# Patient Record
Sex: Female | Born: 1995 | Race: White | Hispanic: No | Marital: Single | Smoking: Never smoker
Health system: Southern US, Community
[De-identification: ages and names within clinical notes are randomized; demographics above are authoritative.]

## PROBLEM LIST (undated history)

## (undated) DIAGNOSIS — B009 Herpesviral infection, unspecified: Secondary | ICD-10-CM

## (undated) DIAGNOSIS — E119 Type 2 diabetes mellitus without complications: Secondary | ICD-10-CM

## (undated) DIAGNOSIS — I10 Essential (primary) hypertension: Secondary | ICD-10-CM

## (undated) DIAGNOSIS — E282 Polycystic ovarian syndrome: Secondary | ICD-10-CM

## (undated) DIAGNOSIS — F419 Anxiety disorder, unspecified: Secondary | ICD-10-CM

## (undated) HISTORY — DX: Essential (primary) hypertension: I10

## (undated) HISTORY — DX: Herpesviral infection, unspecified: B00.9

## (undated) HISTORY — PX: WISDOM TOOTH EXTRACTION: SHX21

## (undated) HISTORY — DX: Anxiety disorder, unspecified: F41.9

## (undated) HISTORY — DX: Polycystic ovarian syndrome: E28.2

---
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
## 2014-01-03 DIAGNOSIS — E118 Type 2 diabetes mellitus with unspecified complications: Secondary | ICD-10-CM

## 2014-01-03 DIAGNOSIS — L293 Anogenital pruritus, unspecified: Secondary | ICD-10-CM | POA: Insufficient documentation

## 2014-01-03 DIAGNOSIS — A6000 Herpesviral infection of urogenital system, unspecified: Secondary | ICD-10-CM | POA: Insufficient documentation

## 2014-01-03 DIAGNOSIS — E1165 Type 2 diabetes mellitus with hyperglycemia: Secondary | ICD-10-CM | POA: Insufficient documentation

## 2014-01-03 DIAGNOSIS — B373 Candidiasis of vulva and vagina: Secondary | ICD-10-CM | POA: Insufficient documentation

## 2014-01-03 DIAGNOSIS — N760 Acute vaginitis: Secondary | ICD-10-CM | POA: Insufficient documentation

## 2014-01-03 DIAGNOSIS — IMO0002 Reserved for concepts with insufficient information to code with codable children: Secondary | ICD-10-CM | POA: Insufficient documentation

## 2014-01-03 DIAGNOSIS — B3731 Acute candidiasis of vulva and vagina: Secondary | ICD-10-CM | POA: Insufficient documentation

## 2014-01-03 DIAGNOSIS — D72829 Elevated white blood cell count, unspecified: Secondary | ICD-10-CM | POA: Insufficient documentation

## 2014-01-04 ENCOUNTER — Telehealth: Payer: Self-pay | Admitting: *Deleted

## 2014-01-04 ENCOUNTER — Encounter (HOSPITAL_COMMUNITY): Payer: Self-pay | Admitting: Emergency Medicine

## 2014-01-04 ENCOUNTER — Inpatient Hospital Stay (HOSPITAL_COMMUNITY)
Admission: EM | Admit: 2014-01-04 | Discharge: 2014-01-04 | Disposition: A | Discharge status: Home / Self Care | Payer: Medicaid Other | Attending: Family Medicine | Admitting: Family Medicine

## 2014-01-04 DIAGNOSIS — N762 Acute vulvitis: Secondary | ICD-10-CM

## 2014-01-04 DIAGNOSIS — E119 Type 2 diabetes mellitus without complications: Secondary | ICD-10-CM

## 2014-01-04 DIAGNOSIS — B373 Candidiasis of vulva and vagina: Secondary | ICD-10-CM

## 2014-01-04 DIAGNOSIS — B3731 Acute candidiasis of vulva and vagina: Secondary | ICD-10-CM

## 2014-01-04 LAB — URINALYSIS, ROUTINE W REFLEX MICROSCOPIC
Bilirubin Urine: NEGATIVE
Glucose, UA: >1000 mg/dL — AB
Ketones, ur: NEGATIVE mg/dL
Nitrite: NEGATIVE
PROTEIN: 100 mg/dL — AB
Specific Gravity, Urine: 1.04 — ABNORMAL HIGH (ref 1.005–1.030)
Urobilinogen, UA: 0.2 mg/dL (ref 0.0–1.0)
pH: 5 (ref 5.0–8.0)

## 2014-01-04 LAB — WET PREP, GENITAL
Clue Cells Wet Prep HPF POC: NONE SEEN
Trich, Wet Prep: NONE SEEN
Yeast Wet Prep HPF POC: NONE SEEN

## 2014-01-04 LAB — BASIC METABOLIC PANEL
BUN: 13 mg/dL (ref 6–23)
CALCIUM: 9.5 mg/dL (ref 8.4–10.5)
CO2: 25 meq/L (ref 19–32)
Chloride: 94 mEq/L — ABNORMAL LOW (ref 96–112)
Creatinine, Ser: 0.54 mg/dL (ref 0.50–1.10)
Glucose, Bld: 264 mg/dL — ABNORMAL HIGH (ref 70–99)
Potassium: 3.7 mEq/L (ref 3.7–5.3)
SODIUM: 136 meq/L — AB (ref 137–147)

## 2014-01-04 LAB — CBC WITH DIFFERENTIAL/PLATELET
BASOS ABS: 0 10*3/uL (ref 0.0–0.1)
BASOS PCT: 0 % (ref 0–1)
EOS ABS: 0.2 10*3/uL (ref 0.0–0.7)
Eosinophils Relative: 1 % (ref 0–5)
HCT: 43.3 % (ref 36.0–46.0)
Hemoglobin: 15.7 g/dL — ABNORMAL HIGH (ref 12.0–15.0)
Lymphocytes Relative: 24 % (ref 12–46)
Lymphs Abs: 3.7 10*3/uL (ref 0.7–4.0)
MCH: 30 pg (ref 26.0–34.0)
MCHC: 36.3 g/dL — ABNORMAL HIGH (ref 30.0–36.0)
MCV: 82.8 fL (ref 78.0–100.0)
Monocytes Absolute: 1 10*3/uL (ref 0.1–1.0)
Monocytes Relative: 6 % (ref 3–12)
NEUTROS PCT: 69 % (ref 43–77)
Neutro Abs: 10.3 10*3/uL — ABNORMAL HIGH (ref 1.7–7.7)
PLATELETS: 421 10*3/uL — AB (ref 150–400)
RBC: 5.23 MIL/uL — AB (ref 3.87–5.11)
RDW: 12.5 % (ref 11.5–15.5)
WBC: 15.2 10*3/uL — ABNORMAL HIGH (ref 4.0–10.5)

## 2014-01-04 LAB — URINE MICROSCOPIC-ADD ON

## 2014-01-04 LAB — POC URINE PREG, ED: Preg Test, Ur: NEGATIVE

## 2014-01-04 LAB — CBG MONITORING, ED: GLUCOSE-CAPILLARY: 259 mg/dL — AB (ref 70–99)

## 2014-01-04 MED ORDER — VALACYCLOVIR HCL 1 G PO TABS: 1000.0000 mg | ORAL_TABLET | Freq: Two times a day (BID) | ORAL | Status: AC

## 2014-01-04 MED ORDER — FLUCONAZOLE 150 MG PO TABS: 150.0000 mg | ORAL_TABLET | Freq: Every day | ORAL | Status: DC

## 2014-01-04 MED ORDER — SODIUM CHLORIDE 0.9 % IV BOLUS (SEPSIS)
1000.0000 mL | Freq: Once | INTRAVENOUS | Status: AC
  Administered 2014-01-04: 1000 mL via INTRAVENOUS

## 2014-01-04 MED ORDER — PIPERACILLIN-TAZOBACTAM 3.375 G IVPB 30 MIN
3.3750 g | Freq: Once | INTRAVENOUS | Status: AC
  Administered 2014-01-04: 3.375 g via INTRAVENOUS
  Filled 2014-01-04: qty 50

## 2014-01-04 MED ORDER — CEPHALEXIN 500 MG PO CAPS: 500.0000 mg | ORAL_CAPSULE | Freq: Four times a day (QID) | ORAL | Status: DC

## 2014-01-04 MED ORDER — FLUCONAZOLE 100 MG PO TABS
150.0000 mg | ORAL_TABLET | Freq: Once | ORAL | Status: AC
  Administered 2014-01-04: 150 mg via ORAL
  Filled 2014-01-04: qty 2

## 2014-01-04 MED ORDER — KETOROLAC TROMETHAMINE 30 MG/ML IJ SOLN
30.0000 mg | Freq: Once | INTRAMUSCULAR | Status: AC
  Administered 2014-01-04: 30 mg via INTRAVENOUS
  Filled 2014-01-04: qty 1

## 2014-01-04 MED ORDER — METFORMIN HCL 1000 MG PO TABS: 1000.0000 mg | ORAL_TABLET | Freq: Two times a day (BID) | ORAL | Status: DC

## 2014-01-04 NOTE — MAU Note (Signed)
C/o vaginal burning and vaginal pain since yesterday; pt thought at first that she had a yeast infection;

## 2014-01-04 NOTE — ED Notes (Signed)
[facility] arrived  

## 2014-01-04 NOTE — ED Notes (Signed)
Unable to give urine specimen at triage.  

## 2014-01-04 NOTE — ED Notes (Signed)
Notified [facility] of transportation to [facility]

## 2014-01-04 NOTE — ED Notes (Signed)
Pt. reports vaginal itching with swelling and pain onset today / dysuria .

## 2014-01-04 NOTE — ED Notes (Signed)
Dr. Gwenlyn FudgeGoldstein performing reassessment

## 2014-01-04 NOTE — Telephone Encounter (Signed)
Message copied by Gerome ApleyZEYFANG, Nyssa Sayegh L on Tue Jan 04, 2014 12:24 PM ------      Message from: [facility] LPRASCH, JENNIFER I      Created: Tue Jan 04, 2014 12:13 PM       GYN clinic            Seen in MAU on 4/7 diagnosed with labial cellulitis, yeast, ? HSV.            Pt is a untreated type 2 diabetic with poor compliance. Can we see her in 2 weeks in the clinic?            [facility] Thank you.  ------

## 2014-01-04 NOTE — MAU Provider Note (Signed)
History     CSN: [id number]  Arrival date and time: 01/03/14 2358   First Provider Initiated Contact with Patient 01/04/14 1117      Chief Complaint  Patient presents with  . Vaginal Itching   HPI  [name] is a 18 y.o. female G0P0 transferred from [place] ED for labial celulitus vs. yeast vaginitis vs. sepsis. The patient is an uncontrolled type 2 diabetic; does not currently have PCP, patient does not check her blood sugars at home. Pt has frequent yeast infections that come and typically subside after three days without treatment.  The vaginal itching started 3-4 days ago and has progressively gotten worse. The patient is not currently sexually active; denies penile-vaginal intercourse, however does admit to oral and finger penetration.    OB History   Grav Para Term Preterm Abortions TAB SAB Ect Mult Living   0               Past Medical History  Diagnosis Date  . Diabetes     Past Surgical History  Procedure Laterality Date  . Wisdom tooth extraction      Family History  Problem Relation Age of Onset  . Hyperthyroidism Mother   . Hypertension Father   . Hyperthyroidism Brother   . Diabetes Paternal Aunt   . Cancer Maternal Grandmother   . Diabetes Paternal Grandmother   . Diabetes Paternal Grandfather   . Heart disease Paternal Grandfather   . Kidney disease Paternal Grandfather   . Hypertension Paternal Grandfather     History  Substance Use Topics  . Smoking status: Never Smoker   . Smokeless tobacco: Not on file  . Alcohol Use: No    Allergies: No Known Allergies  Prescriptions prior to admission  Medication Sig Dispense Refill  . norgestimate-ethinyl estradiol (SPRINTEC 28) 0.25-35 MG-MCG tablet Take 1 tablet by mouth daily.        Results for orders placed during the hospital encounter of 01/04/14 (from the past 48 hour(s))  URINALYSIS, ROUTINE W REFLEX MICROSCOPIC     Status: Abnormal   Collection Time    01/04/14  5:19 AM     Result Value Ref Range   Color, Urine YELLOW  YELLOW   APPearance CLOUDY (*) CLEAR   Specific Gravity, Urine 1.040 (*) 1.005 - 1.030   pH 5.0  5.0 - 8.0   Glucose, UA >1000 (*) NEGATIVE mg/dL   Hgb urine dipstick MODERATE (*) NEGATIVE   Bilirubin Urine NEGATIVE  NEGATIVE   Ketones, ur NEGATIVE  NEGATIVE mg/dL   Protein, ur 100 (*) NEGATIVE mg/dL   Urobilinogen, UA 0.2  0.0 - 1.0 mg/dL   Nitrite NEGATIVE  NEGATIVE   Leukocytes, UA SMALL (*) NEGATIVE  URINE MICROSCOPIC-ADD ON     Status: Abnormal   Collection Time    01/04/14  5:19 AM      Result Value Ref Range   Squamous Epithelial / LPF FEW (*) RARE   WBC, UA 0-2  <3 WBC/hpf   RBC / HPF 3-6  <3 RBC/hpf   Bacteria, UA RARE  RARE   Casts GRANULAR CAST (*) NEGATIVE  POC URINE PREG, ED     Status: None   Collection Time    01/04/14  5:29 AM      Result Value Ref Range   Preg Test, Ur NEGATIVE  NEGATIVE   Comment:            THE SENSITIVITY OF THIS  METHODOLOGY IS >24 mIU/mL  CBG MONITORING, ED     Status: Abnormal   Collection Time    01/04/14  7:03 AM      Result Value Ref Range   Glucose-Capillary 259 (*) 70 - 99 mg/dL  CBC WITH DIFFERENTIAL     Status: Abnormal   Collection Time    01/04/14  7:08 AM      Result Value Ref Range   WBC 15.2 (*) 4.0 - 10.5 K/uL   RBC 5.23 (*) 3.87 - 5.11 MIL/uL   Hemoglobin 15.7 (*) 12.0 - 15.0 g/dL   HCT 43.3  36.0 - 46.0 %   MCV 82.8  78.0 - 100.0 fL   MCH 30.0  26.0 - 34.0 pg   MCHC 36.3 (*) 30.0 - 36.0 g/dL   RDW 12.5  11.5 - 15.5 %   Platelets 421 (*) 150 - 400 K/uL   Neutrophils Relative % 69  43 - 77 %   Neutro Abs 10.3 (*) 1.7 - 7.7 K/uL   Lymphocytes Relative 24  12 - 46 %   Lymphs Abs 3.7  0.7 - 4.0 K/uL   Monocytes Relative 6  3 - 12 %   Monocytes Absolute 1.0  0.1 - 1.0 K/uL   Eosinophils Relative 1  0 - 5 %   Eosinophils Absolute 0.2  0.0 - 0.7 K/uL   Basophils Relative 0  0 - 1 %   Basophils Absolute 0.0  0.0 - 0.1 K/uL  BASIC METABOLIC PANEL     Status:  Abnormal   Collection Time    01/04/14  8:51 AM      Result Value Ref Range   Sodium 136 (*) 137 - 147 mEq/L   Potassium 3.7  3.7 - 5.3 mEq/L   Chloride 94 (*) 96 - 112 mEq/L   CO2 25  19 - 32 mEq/L   Glucose, Bld 264 (*) 70 - 99 mg/dL   BUN 13  6 - 23 mg/dL   Creatinine, Ser 0.54  0.50 - 1.10 mg/dL   Calcium 9.5  8.4 - 10.5 mg/dL   GFR calc non Af Amer >90  >90 mL/min   GFR calc Af Amer >90  >90 mL/min   Comment: (NOTE)     The eGFR has been calculated using the CKD EPI equation.     This calculation has not been validated in all clinical situations.     eGFR's persistently <90 mL/min signify possible Chronic Kidney     Disease.  WET PREP, GENITAL     Status: Abnormal   Collection Time    01/04/14 11:30 AM      Result Value Ref Range   Yeast Wet Prep HPF POC NONE SEEN  NONE SEEN   Trich, Wet Prep NONE SEEN  NONE SEEN   Clue Cells Wet Prep HPF POC NONE SEEN  NONE SEEN   WBC, Wet Prep HPF POC FEW (*) NONE SEEN   Comment: MANY BACTERIA SEEN    Review of Systems  Constitutional: Negative for fever and chills.  Gastrointestinal: Negative for nausea, vomiting and abdominal pain.  Genitourinary: Negative for dysuria and urgency.       + severe vaginal itching and white discharge   Musculoskeletal: Negative for back pain.   Physical Exam   Blood pressure 137/83, pulse 95, temperature 98.3 F (36.8 C), temperature source Oral, resp. rate 18, height 5' 5"  (1.651 m), weight 111.585 kg (246 lb), SpO2 98.00%.  Physical Exam  Constitutional: She  is oriented to person, place, and time. She appears well-developed and well-nourished. No distress.  HENT:  Head: Normocephalic.  Eyes: Pupils are equal, round, and reactive to light.  Neck: Neck supple.  Respiratory: Effort normal.  GI: Soft. She exhibits no distension. There is no tenderness. There is no rebound.  Genitourinary:    There is lesion and injury on the right labia. There is lesion and injury on the left labia. Vaginal  discharge found.  Bilateral labial folds with erythema, edema, and multiple non- tender, pinpoint blisters. Thick, white discharge noted on labial folds.   Speculum exam: Vagina - Moderate amount of thick, white discharge, no odor. Erythema noted on vaginal wall  Cervix - No contact bleeding, no active bleeding  Bimanual exam: Cervix closed, no CMT  Uterus non tender, normal size Adnexa non tender, no masses bilaterally GC/Chlam, wet prep done Chaperone present for exam.   Musculoskeletal: Normal range of motion.  Neurological: She is alert and oriented to person, place, and time.  Skin: Skin is warm. She is not diaphoretic.  Psychiatric: Her behavior is normal.    MAU Course  Procedures None  MDM HSV culture sent Wet prep GC/Chlamydia  Discussed findings with Dr. Nehemiah Settle; plan to treat patient outpatient for yeast vaginitis and ? Labial cellulitis ( WBC 15.2)  and have her follow up in the clinic in 1-2 weeks.  I will RX metformin with 1 refill and refer patient to [facility] for management of type 2 DM.   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

## 2014-01-04 NOTE — ED Notes (Signed)
[facility] called. 

## 2014-01-04 NOTE — ED Notes (Signed)
[facility], will be called back

## 2014-01-04 NOTE — ED Notes (Signed)
Pt states she is experiencing reddened and swollen labia x 3 days.  Area noted to have shaving bumps but not painful per pt.  Labia are cherry red and appear swollen as reported.  C/O increased burning to labia with urination.

## 2014-01-04 NOTE — ED Notes (Signed)
Patient leaving via [facility] now

## 2014-01-04 NOTE — ED Provider Notes (Signed)
CSN: [id number]     Arrival date & time 01/03/14  2358 History   First MD Initiated Contact with Patient 01/04/14 [phone]     Chief Complaint  Patient presents with  . Vaginal Itching     (Consider location/radiation/quality/duration/timing/severity/associated sxs/prior Treatment) HPI Patient is an 18 year old girl with poorly controlled type 2 diabetes. She presents with complaints of burning pain over the labia majora bilaterally. She's not sexually active. She denies vaginal discharge. Her symptoms started 5 days ago with persistent itching. Pain developed 3d ago and has progressively worsened. She denies fever. She does not checked her blood glucose levels.  Patient describes her pain as severe and nonradiating. It is worse with certain movements.  Past Medical History  Diagnosis Date  . Diabetes    History reviewed. No pertinent past surgical history. No family history on file. History  Substance Use Topics  . Smoking status: Never Smoker   . Smokeless tobacco: Not on file  . Alcohol Use: No   OB History   Grav Para Term Preterm Abortions TAB SAB Ect Mult Living                 Review of Systems Ten point review of symptoms performed and is negative with the exception of symptoms noted above.     Allergies  Review of patient's allergies indicates no known allergies.  Home Medications   Current Outpatient Rx  Name  Route  Sig  Dispense  Refill  . norgestimate-ethinyl estradiol (SPRINTEC 28) 0.25-35 MG-MCG tablet   Oral   Take 1 tablet by mouth daily.          BP 151/102  Pulse 91  Temp(Src) 98.4 F (36.9 C) (Oral)  Resp 14  Ht 5\' 5"  (1.651 m)  Wt 246 lb (111.585 kg)  BMI 40.94 kg/m2  SpO2 98% Physical Exam Gen: well developed and well nourished appearing Head: NCAT Eyes: PERL, EOMI Nose: no epistaixis or rhinorrhea Mouth/throat: mucosa is moist and pink Neck: supple, no stridor Lungs: CTA B, no wheezing, rhonchi or rales CV: regular rate and rythm,  good distal pulses.  Abd: soft, notender, nondistended GU: edematous labia majora bilaterally with diffusely and uniformly erythematous skin - most significantly so over the medial aspect of both labia which are exquisitely tender. TTP over the left mons pubis.  Back: no ttp, no cva ttp Skin: warm and dry Ext: no edema, normal to inspection Neuro: CN ii-xii grossly intact, no focal deficits Psyche; normal affect,  calm and cooperative. ED Course  Procedures (including critical care time) Labs Review  Results for orders placed during the hospital encounter of 01/04/14 (from the past 24 hour(s))  URINALYSIS, ROUTINE W REFLEX MICROSCOPIC     Status: Abnormal   Collection Time    01/04/14  5:19 AM      Result Value Ref Range   Color, Urine YELLOW  YELLOW   APPearance CLOUDY (*) CLEAR   Specific Gravity, Urine 1.040 (*) 1.005 - 1.030   pH 5.0  5.0 - 8.0   Glucose, UA >1000 (*) NEGATIVE mg/dL   Hgb urine dipstick MODERATE (*) NEGATIVE   Bilirubin Urine NEGATIVE  NEGATIVE   Ketones, ur NEGATIVE  NEGATIVE mg/dL   Protein, ur 562 (*) NEGATIVE mg/dL   Urobilinogen, UA 0.2  0.0 - 1.0 mg/dL   Nitrite NEGATIVE  NEGATIVE   Leukocytes, UA SMALL (*) NEGATIVE  URINE MICROSCOPIC-ADD ON     Status: Abnormal   Collection Time    01/04/14  5:19 AM      Result Value Ref Range   Squamous Epithelial / LPF FEW (*) RARE   WBC, UA 0-2  <3 WBC/hpf   RBC / HPF 3-6  <3 RBC/hpf   Bacteria, UA RARE  RARE   Casts GRANULAR CAST (*) NEGATIVE  POC URINE PREG, ED     Status: None   Collection Time    01/04/14  5:29 AM      Result Value Ref Range   Preg Test, Ur NEGATIVE  NEGATIVE  CBG MONITORING, ED     Status: Abnormal   Collection Time    01/04/14  7:03 AM      Result Value Ref Range   Glucose-Capillary 259 (*) 70 - 99 mg/dL    MDM   Patient with suspected severe vaginal candidiasis v. Cellulitis.  Case discussed with Dr. Erin FullingHarraway-Smith who [facility] and recommends  empiric tx with IV Zosyn. The accepting team will arrange for CT scan of the region upon [facility].    Brandt LoosenJulie Manly, MD 01/04/14 [phone]

## 2014-01-05 LAB — GC/CHLAMYDIA PROBE AMP
CT Probe RNA: NEGATIVE
GC PROBE AMP APTIMA: NEGATIVE

## 2014-01-05 NOTE — MAU Provider Note (Signed)
Attestation of Attending Supervision of Advanced Practitioner (PA/CNM/NP): Evaluation and management procedures were performed by the Advanced Practitioner under my supervision and collaboration.  I have reviewed the Advanced Practitioner's note and chart, and I agree with the management and plan.  Jacob Stinson, DO Attending Physician Faculty Practice, [facility]  

## 2014-01-06 LAB — HERPES SIMPLEX VIRUS CULTURE: CULTURE: DETECTED

## 2014-01-07 ENCOUNTER — Telehealth: Payer: Self-pay | Admitting: Medical

## 2014-01-07 NOTE — Telephone Encounter (Signed)
LM with patient's father to have her return call to MAU. Patient had + HSV 1 culture. Already on Valtrex, just needs to be informed of diagnosis.   Freddi StarrJulie N Ethier, PA-C 01/07/2014 1:58 PM

## 2014-01-26 ENCOUNTER — Ambulatory Visit: Payer: Medicaid Other | Admitting: Obstetrics & Gynecology

## 2014-06-30 ENCOUNTER — Emergency Department (INDEPENDENT_AMBULATORY_CARE_PROVIDER_SITE_OTHER)
Admission: EM | Admit: 2014-06-30 | Discharge: 2014-06-30 | Disposition: A | Discharge status: Nursing Home (Includes ICF) | Payer: Medicaid Other | Source: Home / Self Care | Attending: Emergency Medicine | Admitting: Emergency Medicine

## 2014-06-30 ENCOUNTER — Encounter (HOSPITAL_COMMUNITY): Payer: Self-pay | Admitting: Emergency Medicine

## 2014-06-30 DIAGNOSIS — S060X1A Concussion with loss of consciousness of 30 minutes or less, initial encounter: Secondary | ICD-10-CM

## 2014-06-30 DIAGNOSIS — S39012A Strain of muscle, fascia and tendon of lower back, initial encounter: Secondary | ICD-10-CM

## 2014-06-30 DIAGNOSIS — S46012A Strain of muscle(s) and tendon(s) of the rotator cuff of left shoulder, initial encounter: Secondary | ICD-10-CM

## 2014-06-30 DIAGNOSIS — Y92410 Unspecified street and highway as the place of occurrence of the external cause: Secondary | ICD-10-CM

## 2014-06-30 NOTE — Discharge Instructions (Signed)
We have determined that your problem requires further evaluation in the emergency department.  We will take care of your transport there.  Once at the emergency department, you will be evaluated by a provider and they will order whatever treatment or tests they deem necessary.  We cannot guarantee that they will do any specific test or do any specific treatment.  ° °
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
## 2014-10-12 ENCOUNTER — Emergency Department: Payer: Self-pay | Admitting: Emergency Medicine

## 2014-10-12 LAB — CBC
HCT: 44.9 % (ref 35.0–47.0)
HGB: 14.9 g/dL (ref 12.0–16.0)
MCH: 28.8 pg (ref 26.0–34.0)
MCHC: 33.2 g/dL (ref 32.0–36.0)
MCV: 87 fL (ref 80–100)
PLATELETS: 445 10*3/uL — AB (ref 150–440)
RBC: 5.18 10*6/uL (ref 3.80–5.20)
RDW: 12.3 % (ref 11.5–14.5)
WBC: 13.7 10*3/uL — AB (ref 3.6–11.0)

## 2014-10-12 LAB — COMPREHENSIVE METABOLIC PANEL
ALBUMIN: 3.5 g/dL — AB (ref 3.8–5.6)
AST: 32 U/L — AB (ref 0–26)
Alkaline Phosphatase: 86 U/L
Anion Gap: 10 (ref 7–16)
BUN: 13 mg/dL (ref 7–18)
Bilirubin,Total: 0.2 mg/dL (ref 0.2–1.0)
CO2: 27 mmol/L (ref 21–32)
Calcium, Total: 8.9 mg/dL — ABNORMAL LOW (ref 9.0–10.7)
Chloride: 105 mmol/L (ref 98–107)
Creatinine: 0.75 mg/dL (ref 0.60–1.30)
EGFR (African American): >60
Glucose: 183 mg/dL — ABNORMAL HIGH (ref 65–99)
OSMOLALITY: 288 (ref 275–301)
POTASSIUM: 3.9 mmol/L (ref 3.5–5.1)
SGPT (ALT): 54 U/L
Sodium: 142 mmol/L (ref 136–145)
Total Protein: 7.7 g/dL (ref 6.4–8.6)

## 2014-10-12 LAB — URINALYSIS, COMPLETE
BILIRUBIN, UR: NEGATIVE
BLOOD: NEGATIVE
Glucose,UR: >=500 mg/dL (ref 0–75)
Ketone: NEGATIVE
LEUKOCYTE ESTERASE: NEGATIVE
Nitrite: NEGATIVE
Ph: 6 (ref 4.5–8.0)
Protein: 30
RBC,UR: 4 /HPF (ref 0–5)
Specific Gravity: 1.026 (ref 1.003–1.030)
WBC UR: <1 /HPF (ref 0–5)

## 2014-10-12 LAB — LIPASE, BLOOD: Lipase: 75 U/L (ref 73–393)

## 2014-11-20 ENCOUNTER — Emergency Department (HOSPITAL_COMMUNITY): Payer: Medicaid Other

## 2014-11-20 ENCOUNTER — Emergency Department (HOSPITAL_COMMUNITY)
Admission: EM | Admit: 2014-11-20 | Discharge: 2014-11-20 | Disposition: A | Discharge status: Home / Self Care | Payer: Medicaid Other | Attending: Emergency Medicine | Admitting: Emergency Medicine

## 2014-11-20 DIAGNOSIS — N912 Amenorrhea, unspecified: Secondary | ICD-10-CM | POA: Insufficient documentation

## 2014-11-20 DIAGNOSIS — Z79899 Other long term (current) drug therapy: Secondary | ICD-10-CM | POA: Insufficient documentation

## 2014-11-20 DIAGNOSIS — Z3202 Encounter for pregnancy test, result negative: Secondary | ICD-10-CM | POA: Diagnosis not present

## 2014-11-20 DIAGNOSIS — R1011 Right upper quadrant pain: Secondary | ICD-10-CM | POA: Diagnosis present

## 2014-11-20 DIAGNOSIS — Z792 Long term (current) use of antibiotics: Secondary | ICD-10-CM | POA: Insufficient documentation

## 2014-11-20 DIAGNOSIS — E119 Type 2 diabetes mellitus without complications: Secondary | ICD-10-CM | POA: Insufficient documentation

## 2014-11-20 DIAGNOSIS — K8050 Calculus of bile duct without cholangitis or cholecystitis without obstruction: Secondary | ICD-10-CM | POA: Diagnosis not present

## 2014-11-20 LAB — COMPREHENSIVE METABOLIC PANEL
ALT: 29 U/L (ref 0–35)
AST: 18 U/L (ref 0–37)
Albumin: 3.5 g/dL (ref 3.5–5.2)
Alkaline Phosphatase: 78 U/L (ref 39–117)
Anion gap: 8 (ref 5–15)
BILIRUBIN TOTAL: 0.3 mg/dL (ref 0.3–1.2)
BUN: 8 mg/dL (ref 6–23)
CO2: 26 mmol/L (ref 19–32)
Calcium: 9.2 mg/dL (ref 8.4–10.5)
Chloride: 102 mmol/L (ref 96–112)
Creatinine, Ser: 0.71 mg/dL (ref 0.50–1.10)
GFR calc Af Amer: >90 mL/min (ref >90)
GFR calc non Af Amer: >90 mL/min (ref >90)
Glucose, Bld: 313 mg/dL — ABNORMAL HIGH (ref 70–99)
POTASSIUM: 3.9 mmol/L (ref 3.5–5.1)
SODIUM: 136 mmol/L (ref 135–145)
TOTAL PROTEIN: 6.9 g/dL (ref 6.0–8.3)

## 2014-11-20 LAB — CBC WITH DIFFERENTIAL/PLATELET
Basophils Absolute: 0 10*3/uL (ref 0.0–0.1)
Basophils Relative: 0 % (ref 0–1)
Eosinophils Absolute: 0.2 10*3/uL (ref 0.0–0.7)
Eosinophils Relative: 2 % (ref 0–5)
HCT: 42.3 % (ref 36.0–46.0)
HEMOGLOBIN: 14.5 g/dL (ref 12.0–15.0)
LYMPHS ABS: 3.3 10*3/uL (ref 0.7–4.0)
LYMPHS PCT: 32 % (ref 12–46)
MCH: 29.2 pg (ref 26.0–34.0)
MCHC: 34.3 g/dL (ref 30.0–36.0)
MCV: 85.1 fL (ref 78.0–100.0)
MONOS PCT: 7 % (ref 3–12)
Monocytes Absolute: 0.7 10*3/uL (ref 0.1–1.0)
NEUTROS PCT: 59 % (ref 43–77)
Neutro Abs: 6.1 10*3/uL (ref 1.7–7.7)
Platelets: 423 10*3/uL — ABNORMAL HIGH (ref 150–400)
RBC: 4.97 MIL/uL (ref 3.87–5.11)
RDW: 12.4 % (ref 11.5–15.5)
WBC: 10.3 10*3/uL (ref 4.0–10.5)

## 2014-11-20 LAB — LIPASE, BLOOD: Lipase: 24 U/L (ref 11–59)

## 2014-11-20 LAB — URINALYSIS, ROUTINE W REFLEX MICROSCOPIC
BILIRUBIN URINE: NEGATIVE
Glucose, UA: >1000 mg/dL — AB
Ketones, ur: NEGATIVE mg/dL
LEUKOCYTES UA: NEGATIVE
Nitrite: NEGATIVE
PH: 6 (ref 5.0–8.0)
Protein, ur: NEGATIVE mg/dL
Specific Gravity, Urine: 1.041 — ABNORMAL HIGH (ref 1.005–1.030)
Urobilinogen, UA: 0.2 mg/dL (ref 0.0–1.0)

## 2014-11-20 LAB — URINE MICROSCOPIC-ADD ON

## 2014-11-20 LAB — PREGNANCY, URINE: PREG TEST UR: NEGATIVE

## 2014-11-20 MED ORDER — HYDROCODONE-ACETAMINOPHEN 5-325 MG PO TABS: 1.0000 | ORAL_TABLET | Freq: Four times a day (QID) | ORAL | Status: DC | PRN

## 2014-11-20 NOTE — ED Provider Notes (Signed)
CSN: [id number]     Arrival date & time 11/20/14  1600 History   First MD Initiated Contact with Patient 11/20/14 1801     Chief Complaint  Patient presents with  . Abdominal Pain     (Consider location/radiation/quality/duration/timing/severity/associated sxs/prior Treatment) HPI Complains of right upper quadrant pain onset 2 days ago waxes and wanes, similar to pain that she had January 2016 when she went to [facility] emergency department. Pain became worse today after eating [employer] at 12:30 PM today  Patient had abdominal ultrasound on 10/12/2014 which showed a single 6 mm nonmobile density and gallbladder, stone versus polyp. She presently states pain is mild. No nausea no vomiting no fever no other associated symptoms. No treatment prior to coming here. Nothing makes symptoms better or worse.. No other associated symptoms Past Medical History  Diagnosis Date  . Diabetes    polycystic ovarian syndrome Past Surgical History  Procedure Laterality Date  . Wisdom tooth extraction     Family History  Problem Relation Age of Onset  . Hyperthyroidism Mother   . Hypertension Father   . Hyperthyroidism Brother   . Diabetes Paternal Aunt   . Cancer Maternal Grandmother   . Diabetes Paternal Grandmother   . Diabetes Paternal Grandfather   . Heart disease Paternal Grandfather   . Kidney disease Paternal Grandfather   . Hypertension Paternal Grandfather    History  Substance Use Topics  . Smoking status: Never Smoker   . Smokeless tobacco: Not on file  . Alcohol Use: No   OB History    Gravida Para Term Preterm AB TAB SAB Ectopic Multiple Living   0              Review of Systems  Constitutional: Negative.   HENT: Negative.   Respiratory: Negative.   Cardiovascular: Negative.   Gastrointestinal: Positive for abdominal pain.  Genitourinary:       Amenorrheic  Musculoskeletal: Negative.   Skin: Negative.   Neurological: Negative.    Psychiatric/Behavioral: Negative.   All other systems reviewed and are negative.     Allergies  Review of patient's allergies indicates no known allergies.  Home Medications   Prior to Admission medications   Medication Sig Start Date End Date Taking? Authorizing Provider  metFORMIN (GLUCOPHAGE) 1000 MG tablet Take 1 tablet (1,000 mg total) by mouth 2 (two) times daily with a meal. 01/04/14  Yes Iona Hansen Rasch, NP  cephALEXin (KEFLEX) 500 MG capsule Take 1 capsule (500 mg total) by mouth 4 (four) times daily. Patient not taking: Reported on 11/20/2014 01/04/14   Iona Hansen Rasch, NP  fluconazole (DIFLUCAN) 150 MG tablet Take 1 tablet (150 mg total) by mouth daily. Take 1 pill on 4/10; take 1 pill on 4/14 Patient not taking: Reported on 11/20/2014 01/04/14   Iona Hansen Rasch, NP   BP 133/80 mmHg  Pulse 87  Temp(Src) 98.5 F (36.9 C) (Oral)  Resp 25  Ht  (1.651 m)  SpO2 97% Physical Exam  Constitutional: She appears well-developed and well-nourished.  HENT:  Head: Normocephalic and atraumatic.  Eyes: Conjunctivae are normal. Pupils are equal, round, and reactive to light.  Neck: Neck supple. No tracheal deviation present. No thyromegaly present.  Cardiovascular: Normal rate and regular rhythm.   No murmur heard. Pulmonary/Chest: Effort normal and breath sounds normal.  Abdominal: Soft. Bowel sounds are normal. She exhibits no distension. There is tenderness.  Obese tender at right upper quadrant negative Murphy sign  Musculoskeletal: Normal  range of motion. She exhibits no edema or tenderness.  Neurological: She is alert. Coordination normal.  Skin: Skin is warm and dry. No rash noted.  Psychiatric: She has a normal mood and affect.  Nursing note and vitals reviewed.   ED Course  Procedures (including critical care time) Labs Review Labs Reviewed  CBC WITH DIFFERENTIAL/PLATELET - Abnormal; Notable for the following:    Platelets 423 (*)    All other  components within normal limits  COMPREHENSIVE METABOLIC PANEL - Abnormal; Notable for the following:    Glucose, Bld 313 (*)    All other components within normal limits  LIPASE, BLOOD  URINALYSIS, ROUTINE W REFLEX MICROSCOPIC  PREGNANCY, URINE    Imaging Review No results found.   EKG Interpretation None     Declines pain medicine presently  9:50 PM pain is controlled. Results for orders placed or performed during the hospital encounter of 11/20/14  CBC with Differential  Result Value Ref Range   WBC 10.3 4.0 - 10.5 K/uL   RBC 4.97 3.87 - 5.11 MIL/uL   Hemoglobin 14.5 12.0 - 15.0 g/dL   HCT 16.142.3 09.636.0 - 04.546.0 %   MCV 85.1 78.0 - 100.0 fL   MCH 29.2 26.0 - 34.0 pg   MCHC 34.3 30.0 - 36.0 g/dL   RDW 40.912.4 81.111.5 - 91.415.5 %   Platelets 423 (H) 150 - 400 K/uL   Neutrophils Relative % 59 43 - 77 %   Neutro Abs 6.1 1.7 - 7.7 K/uL   Lymphocytes Relative 32 12 - 46 %   Lymphs Abs 3.3 0.7 - 4.0 K/uL   Monocytes Relative 7 3 - 12 %   Monocytes Absolute 0.7 0.1 - 1.0 K/uL   Eosinophils Relative 2 0 - 5 %   Eosinophils Absolute 0.2 0.0 - 0.7 K/uL   Basophils Relative 0 0 - 1 %   Basophils Absolute 0.0 0.0 - 0.1 K/uL  Comprehensive metabolic panel  Result Value Ref Range   Sodium 136 135 - 145 mmol/L   Potassium 3.9 3.5 - 5.1 mmol/L   Chloride 102 96 - 112 mmol/L   CO2 26 19 - 32 mmol/L   Glucose, Bld 313 (H) 70 - 99 mg/dL   BUN 8 6 - 23 mg/dL   Creatinine, Ser 7.820.71 0.50 - 1.10 mg/dL   Calcium 9.2 8.4 - 95.610.5 mg/dL   Total Protein 6.9 6.0 - 8.3 g/dL   Albumin 3.5 3.5 - 5.2 g/dL   AST 18 0 - 37 U/L   ALT 29 0 - 35 U/L   Alkaline Phosphatase 78 39 - 117 U/L   Total Bilirubin 0.3 0.3 - 1.2 mg/dL   GFR calc non Af Amer >90 >90 mL/min   GFR calc Af Amer >90 >90 mL/min   Anion gap 8 5 - 15  Lipase, blood  Result Value Ref Range   Lipase 24 11 - 59 U/L  Urinalysis, Routine w reflex microscopic  Result Value Ref Range   Color, Urine STRAW (A) YELLOW   APPearance CLEAR CLEAR    Specific Gravity, Urine 1.041 (H) 1.005 - 1.030   pH 6.0 5.0 - 8.0   Glucose, UA >1000 (A) NEGATIVE mg/dL   Hgb urine dipstick TRACE (A) NEGATIVE   Bilirubin Urine NEGATIVE NEGATIVE   Ketones, ur NEGATIVE NEGATIVE mg/dL   Protein, ur NEGATIVE NEGATIVE mg/dL   Urobilinogen, UA 0.2 0.0 - 1.0 mg/dL   Nitrite NEGATIVE NEGATIVE   Leukocytes, UA NEGATIVE NEGATIVE  Pregnancy,  urine  Result Value Ref Range   Preg Test, Ur NEGATIVE NEGATIVE  Urine microscopic-add on  Result Value Ref Range   Squamous Epithelial / LPF MANY (A) RARE   WBC, UA 0-2 <3 WBC/hpf   RBC / HPF 0-2 <3 RBC/hpf   Bacteria, UA RARE RARE   US Abdomen Limited  11/20/2014   CLINICAL DATA:  19 year old female with a history of right upper quadrant pain  EXAM: US ABDOMEN LIMITED - RIGHT UPPER QUADRANT  COMPARISON:  10/12/2014  FINDINGS: Gallbladder:  Heterogeneous fluid within the gallbladder lumen, with gallbladder wall measuring 2 mm -3 mm. No pericholecystic fluid. Negative sonographic Murphy sign.  Common bile duct:  Diameter: 3 mm  Liver:  Coarsened echotexture of the liver.  IMPRESSION: Sonographic survey of the right upper quadrant negative for acute cholecystitis.  Evidence of cholelithiasis and/or gallbladder sludge.  Coarsened echotexture of the liver, suggesting steatosis.  Signed,  Yvone Neu. Loreta Ave, DO  [facility]  [facility]   Electronically Signed   By: Gilmer Mor D.O.   On: 11/20/2014 20:20    MDM  I spoke with Dr. Dwain Sarna. Plan prescription Norco. Follow-up at [facility] surgery for next available surgeon. I discussed with patient to avoid fatty or fried foods Diagnosis #1 biliary colic #2 hyperglycemia Final diagnoses:  None        Doug Sou, MD 11/20/14 2155

## 2014-11-20 NOTE — ED Notes (Signed)
Patient transported to Ultrasound 

## 2014-11-20 NOTE — Discharge Instructions (Signed)
Biliary Colic  Avoid greasy or fatty foods or fried foods as they will increase your pain. Call [facility] surgery tomorrow to arrange to be seen by the next available surgeon to get your gallbladder taken out. Take Tylenol for mild pain or the pain medicine prescribed for bad pain. Return if you develop fever. Vomiting ,, or if your pain is not well controlled by the medication prescribed Biliary colic is a steady or irregular pain in the upper abdomen. It is usually under the right side of the rib cage. It happens when gallstones interfere with the normal flow of bile from the gallbladder. Bile is a liquid that helps to digest fats. Bile is made in the liver and stored in the gallbladder. When you eat a meal, bile passes from the gallbladder through the cystic duct and the common bile duct into the small intestine. There, it mixes with partially digested food. If a gallstone blocks either of these ducts, the normal flow of bile is blocked. The muscle cells in the bile duct contract forcefully to try to move the stone. This causes the pain of biliary colic.  SYMPTOMS   A person with biliary colic usually complains of pain in the upper abdomen. This pain can be:  In the center of the upper abdomen just below the breastbone.  In the upper-right part of the abdomen, near the gallbladder and liver.  Spread back toward the right shoulder blade.  Nausea and vomiting.  The pain usually occurs after eating.  Biliary colic is usually triggered by the digestive system's demand for bile. The demand for bile is high after fatty meals. Symptoms can also occur when a person who has been fasting suddenly eats a very large meal. Most episodes of biliary colic pass after 1 to 5 hours. After the most intense pain passes, your abdomen may continue to ache mildly for about 24 hours. DIAGNOSIS  After you describe your symptoms, your caregiver will perform a physical exam. He or she will pay attention to the  upper right portion of your belly (abdomen). This is the area of your liver and gallbladder. An ultrasound will help your caregiver look for gallstones. Specialized scans of the gallbladder may also be done. Blood tests may be done, especially if you have fever or if your pain persists. PREVENTION  Biliary colic can be prevented by controlling the risk factors for gallstones. Some of these risk factors, such as heredity, increasing age, and pregnancy are a normal part of life. Obesity and a high-fat diet are risk factors you can change through a healthy lifestyle. Women going through menopause who take hormone replacement therapy (estrogen) are also more likely to develop biliary colic. TREATMENT   Pain medication may be prescribed.  You may be encouraged to eat a fat-free diet.  If the first episode of biliary colic is severe, or episodes of colic keep retuning, surgery to remove the gallbladder (cholecystectomy) is usually recommended. This procedure can be done through small incisions using an instrument called a laparoscope. The procedure often requires a brief stay in the hospital. Some people can leave the hospital the same day. It is the most widely used treatment in people troubled by painful gallstones. It is effective and safe, with no complications in more than 90% of cases.  If surgery cannot be done, medication that dissolves gallstones may be used. This medication is expensive and can take months or years to work. Only small stones will dissolve.  Rarely, medication to  dissolve gallstones is combined with a procedure called shock-wave lithotripsy. This procedure uses carefully aimed shock waves to break up gallstones. In many people treated with this procedure, gallstones form again within a few years. PROGNOSIS  If gallstones block your cystic duct or common bile duct, you are at risk for repeated episodes of biliary colic. There is also a 25% chance that you will develop a gallbladder  infection(acute cholecystitis), or some other complication of gallstones within 10 to 20 years. If you have surgery, schedule it at a time that is convenient for you and at a time when you are not sick. HOME CARE INSTRUCTIONS   Drink plenty of clear fluids.  Avoid fatty, greasy or fried foods, or any foods that make your pain worse.  Take medications as directed. SEEK MEDICAL CARE IF:   You develop a fever over 100.5 F (38.1 C).  Your pain gets worse over time.  You develop nausea that prevents you from eating and drinking.  You develop vomiting. SEEK IMMEDIATE MEDICAL CARE IF:   You have continuous or severe belly (abdominal) pain which is not relieved with medications.  You develop nausea and vomiting which is not relieved with medications.  You have symptoms of biliary colic and you suddenly develop a fever and shaking chills. This may signal cholecystitis. Call your caregiver immediately.  You develop a yellow color to your skin or the white part of your eyes (jaundice). Document Released: 02/17/2006 Document Revised: 12/09/2011 Document Reviewed: 04/28/2008 [facility] Patient Information 2015 [place], [place]. This information is not intended to replace advice given to you by your health care provider. Make sure you discuss any questions you have with your health care provider.

## 2014-11-20 NOTE — ED Notes (Signed)
Pt reports gall bladder pain for a couple of days. Pt reports hx of the same.

## 2014-12-29 DIAGNOSIS — J45901 Unspecified asthma with (acute) exacerbation: Secondary | ICD-10-CM | POA: Insufficient documentation

## 2014-12-29 DIAGNOSIS — E119 Type 2 diabetes mellitus without complications: Secondary | ICD-10-CM | POA: Insufficient documentation

## 2014-12-29 DIAGNOSIS — R05 Cough: Secondary | ICD-10-CM | POA: Diagnosis present

## 2014-12-30 ENCOUNTER — Emergency Department (HOSPITAL_COMMUNITY): Payer: Medicaid Other

## 2014-12-30 ENCOUNTER — Encounter (HOSPITAL_COMMUNITY): Payer: Self-pay | Admitting: Emergency Medicine

## 2014-12-30 ENCOUNTER — Emergency Department (HOSPITAL_COMMUNITY)
Admission: EM | Admit: 2014-12-30 | Discharge: 2014-12-30 | Disposition: A | Discharge status: Home / Self Care | Payer: Medicaid Other | Attending: Emergency Medicine | Admitting: Emergency Medicine

## 2014-12-30 DIAGNOSIS — J209 Acute bronchitis, unspecified: Secondary | ICD-10-CM

## 2014-12-30 MED ORDER — PREDNISONE 20 MG PO TABS
60.0000 mg | ORAL_TABLET | Freq: Once | ORAL | Status: AC
  Administered 2014-12-30: 60 mg via ORAL
  Filled 2014-12-30: qty 3

## 2014-12-30 MED ORDER — BENZONATATE 100 MG PO CAPS
100.0000 mg | ORAL_CAPSULE | Freq: Three times a day (TID) | ORAL | Status: DC
Start: 2014-12-30 — End: 2015-07-22

## 2014-12-30 MED ORDER — ALBUTEROL SULFATE HFA 108 (90 BASE) MCG/ACT IN AERS
INHALATION_SPRAY | RESPIRATORY_TRACT | Status: AC
  Filled 2014-12-30: qty 6.7

## 2014-12-30 MED ORDER — ALBUTEROL SULFATE HFA 108 (90 BASE) MCG/ACT IN AERS
1.0000 | INHALATION_SPRAY | RESPIRATORY_TRACT | Status: DC | PRN
  Administered 2014-12-30: 2 via RESPIRATORY_TRACT

## 2014-12-30 MED ORDER — ALBUTEROL SULFATE (2.5 MG/3ML) 0.083% IN NEBU
5.0000 mg | INHALATION_SOLUTION | Freq: Once | RESPIRATORY_TRACT | Status: AC
  Administered 2014-12-30: 5 mg via RESPIRATORY_TRACT
  Filled 2014-12-30: qty 6

## 2014-12-30 MED ORDER — PREDNISONE 20 MG PO TABS: ORAL_TABLET | ORAL | Status: DC

## 2014-12-30 NOTE — ED Notes (Signed)
Patient here with cough for 1 week and associated discomfort in chest. States history of childhood asthma. Denies coughing up mucus, and fevers.

## 2014-12-30 NOTE — Discharge Instructions (Signed)
Acute Bronchitis Bronchitis is inflammation of the airways that extend from the windpipe into the lungs (bronchi). The inflammation often causes mucus to develop. This leads to a cough, which is the most common symptom of bronchitis.  In acute bronchitis, the condition usually develops suddenly and goes away over time, usually in a couple weeks. Smoking, allergies, and asthma can make bronchitis worse. Repeated episodes of bronchitis may cause further lung problems.  CAUSES Acute bronchitis is most often caused by the same virus that causes a cold. The virus can spread from person to person (contagious) through coughing, sneezing, and touching contaminated objects. SIGNS AND SYMPTOMS   Cough.   Fever.   Coughing up mucus.   Body aches.   Chest congestion.   Chills.   Shortness of breath.   Sore throat.  DIAGNOSIS  Acute bronchitis is usually diagnosed through a physical exam. Your health care provider will also ask you questions about your medical history. Tests, such as chest X-rays, are sometimes done to rule out other conditions.  TREATMENT  Acute bronchitis usually goes away in a couple weeks. Oftentimes, no medical treatment is necessary. Medicines are sometimes given for relief of fever or cough. Antibiotic medicines are usually not needed but may be prescribed in certain situations. In some cases, an inhaler may be recommended to help reduce shortness of breath and control the cough. A cool mist vaporizer may also be used to help thin bronchial secretions and make it easier to clear the chest.  HOME CARE INSTRUCTIONS  Get plenty of rest.   Drink enough fluids to keep your urine clear or pale yellow (unless you have a medical condition that requires fluid restriction). Increasing fluids may help thin your respiratory secretions (sputum) and reduce chest congestion, and it will prevent dehydration.   Take medicines only as directed by your health care provider.  If  you were prescribed an antibiotic medicine, finish it all even if you start to feel better.  Avoid smoking and secondhand smoke. Exposure to cigarette smoke or irritating chemicals will make bronchitis worse. If you are a smoker, consider using nicotine gum or skin patches to help control withdrawal symptoms. Quitting smoking will help your lungs heal faster.   Reduce the chances of another bout of acute bronchitis by washing your hands frequently, avoiding people with cold symptoms, and trying not to touch your hands to your mouth, nose, or eyes.   Keep all follow-up visits as directed by your health care provider.  SEEK MEDICAL CARE IF: Your symptoms do not improve after 1 week of treatment.  SEEK IMMEDIATE MEDICAL CARE IF:  You develop an increased fever or chills.   You have chest pain.   You have severe shortness of breath.  You have bloody sputum.   You develop dehydration.  You faint or repeatedly feel like you are going to pass out.  You develop repeated vomiting.  You develop a severe headache. MAKE SURE YOU:   Understand these instructions.  Will watch your condition.  Will get help right away if you are not doing well or get worse. Document Released: 10/24/2004 Document Revised: 01/31/2014 Document Reviewed: 03/09/2013 Exit[employer] Patient Information 2015 [employer]. This information is not intended to replace advice given to you by your health care provider. Make sure you discuss any questions you have with your health care provider. Bronchospasm A bronchospasm is a spasm or tightening of the airways going into the lungs. During a bronchospasm breathing becomes more difficult because the   airways get smaller. When this happens there can be coughing, a whistling sound when breathing (wheezing), and difficulty breathing. Bronchospasm is often associated with asthma, but not all patients who experience a bronchospasm have asthma. CAUSES  A bronchospasm is caused  by inflammation or irritation of the airways. The inflammation or irritation may be triggered by:   Allergies (such as to animals, pollen, food, or mold). Allergens that cause bronchospasm may cause wheezing immediately after exposure or many hours later.   Infection. Viral infections are believed to be the most common cause of bronchospasm.   Exercise.   Irritants (such as pollution, cigarette smoke, strong odors, aerosol sprays, and paint fumes).   Weather changes. Winds increase molds and pollens in the air. Rain refreshes the air by washing irritants out. Cold air may cause inflammation.   Stress and emotional upset.  SIGNS AND SYMPTOMS   Wheezing.   Excessive nighttime coughing.   Frequent or severe coughing with a simple cold.   Chest tightness.   Shortness of breath.  DIAGNOSIS  Bronchospasm is usually diagnosed through a history and physical exam. Tests, such as chest X-rays, are sometimes done to look for other conditions. TREATMENT   Inhaled medicines can be given to open up your airways and help you breathe. The medicines can be given using either an inhaler or a nebulizer machine.  Corticosteroid medicines may be given for severe bronchospasm, usually when it is associated with asthma. HOME CARE INSTRUCTIONS   Always have a plan prepared for seeking medical care. Know when to call your health care provider and local emergency services (911 in the [place] Know where you can access local emergency care.  Only take medicines as directed by your health care provider.  If you were prescribed an inhaler or nebulizer machine, ask your health care provider to explain how to use it correctly. Always use a spacer with your inhaler if you were given one.  It is necessary to remain calm during an attack. Try to relax and breathe more slowly.  Control your home environment in the following ways:   Change your heating and air conditioning filter at least once a  month.   Limit your use of fireplaces and wood stoves.  Do not smoke and do not allow smoking in your home.   Avoid exposure to perfumes and fragrances.   Get rid of pests (such as roaches and mice) and their droppings.   Throw away plants if you see mold on them.   Keep your house clean and dust free.   Replace carpet with wood, tile, or vinyl flooring. Carpet can trap dander and dust.   Use allergy-proof pillows, mattress covers, and box spring covers.   Wash bed sheets and blankets every week in hot water and dry them in a dryer.   Use blankets that are made of polyester or cotton.   Wash hands frequently. SEEK MEDICAL CARE IF:   You have muscle aches.   You have chest pain.   The sputum changes from clear or white to yellow, green, gray, or bloody.   The sputum you cough up gets thicker.   There are problems that may be related to the medicine you are given, such as a rash, itching, swelling, or trouble breathing.  SEEK IMMEDIATE MEDICAL CARE IF:   You have worsening wheezing and coughing even after taking your prescribed medicines.   You have increased difficulty breathing.   You develop severe chest pain. MAKE SURE YOU:     Understand these instructions.  Will watch your condition.  Will get help right away if you are not doing well or get worse. Document Released: 09/19/2003 Document Revised: 09/21/2013 Document Reviewed: 03/08/2013 ExitCare Patient Information 2015 [employer]. This information is not intended to replace advice given to you by your health care provider. Make sure you discuss any questions you have with your health care provider.  

## 2014-12-30 NOTE — ED Provider Notes (Signed)
CSN: [id number]     Arrival date & time 12/29/14  2357 History  This chart was scribed for Loren Raceravid Katrinka Herbison, MD by Annye AsaAnna Dorsett, ED Scribe. This patient was seen in room D31C/D31C and the patient's care was started at 12:31 AM.    Chief Complaint  Patient presents with  . Cough   Patient is a 19 y.o. female presenting with cough. The history is provided by the patient. No language interpreter was used.  Cough Associated symptoms: no chest pain, no chills, no fever, no headaches, no myalgias, no rash, no shortness of breath, no sore throat and no wheezing      HPI Comments: Debra Miles is a 19 y.o. with past medical history of DM and childhood asthmawho presents to the Emergency Department complaining of 1 week of gradually worsening nonproductive cough with associated chest discomfort. She reports sore throat, beginning today. She also reports sigus congestion and rhinorrhea that have gradually resolved with time. She denies fevers, chills, abdominal pain, nausea, vomiting, pain or swelling in the legs, sick contacts.  Past Medical History  Diagnosis Date  . Diabetes    Past Surgical History  Procedure Laterality Date  . Wisdom tooth extraction     Family History  Problem Relation Age of Onset  . Hyperthyroidism Mother   . Hypertension Father   . Hyperthyroidism Brother   . Diabetes Paternal Aunt   . Cancer Maternal Grandmother   . Diabetes Paternal Grandmother   . Diabetes Paternal Grandfather   . Heart disease Paternal Grandfather   . Kidney disease Paternal Grandfather   . Hypertension Paternal Grandfather    History  Substance Use Topics  . Smoking status: Never Smoker   . Smokeless tobacco: Not on file  . Alcohol Use: No   OB History    Gravida Para Term Preterm AB TAB SAB Ectopic Multiple Living   0              Review of Systems  Constitutional: Negative for fever, chills and fatigue.  HENT: Positive for congestion. Negative for sinus pressure and sore  throat.   Respiratory: Positive for cough. Negative for shortness of breath and wheezing.   Cardiovascular: Negative for chest pain.  Gastrointestinal: Negative for nausea, vomiting, abdominal pain, diarrhea and constipation.  Musculoskeletal: Negative for myalgias, back pain, neck pain and neck stiffness.  Skin: Negative for rash and wound.  Neurological: Negative for dizziness, weakness, light-headedness, numbness and headaches.  All other systems reviewed and are negative.  Allergies  Review of patient's allergies indicates no known allergies.  Home Medications   Prior to Admission medications   Medication Sig Start Date End Date Taking? Authorizing Provider  benzonatate (TESSALON) 100 MG capsule Take 1 capsule (100 mg total) by mouth every 8 (eight) hours. 12/30/14   Loren Raceravid Tanganyika Bowlds, MD  cephALEXin (KEFLEX) 500 MG capsule Take 1 capsule (500 mg total) by mouth 4 (four) times daily. Patient not taking: Reported on 11/20/2014 01/04/14   Duane LopeJennifer I Rasch, NP  fluconazole (DIFLUCAN) 150 MG tablet Take 1 tablet (150 mg total) by mouth daily. Take 1 pill on 4/10; take 1 pill on 4/14 Patient not taking: Reported on 11/20/2014 01/04/14   Duane LopeJennifer I Rasch, NP  HYDROcodone-acetaminophen (NORCO) 5-325 MG per tablet Take 1-2 tablets by mouth every 6 (six) hours as needed. 11/20/14   Doug SouSam Jacubowitz, MD  metFORMIN (GLUCOPHAGE) 1000 MG tablet Take 1 tablet (1,000 mg total) by mouth 2 (two) times daily with a meal. 01/04/14  Duane Lope, NP  predniSONE (DELTASONE) 20 MG tablet 3 tabs po day one, then 2 po daily x 4 days 12/30/14   Loren Racer, MD   BP 131/76 mmHg  Pulse 118  Temp(Src) 98.5 F (36.9 C) (Oral)  Resp 18  Ht  (1.651 m)  Wt 244 lb (110.678 kg)  BMI 40.60 kg/m2  SpO2 98%  LMP  Physical Exam  Constitutional: She is oriented to person, place, and time. She appears well-developed and well-nourished. No distress.  HENT:  Head: Normocephalic and atraumatic.  Mouth/Throat:  Oropharynx is clear and moist. No oropharyngeal exudate.  No sinus tenderness  Eyes: EOM are normal. Pupils are equal, round, and reactive to light.  Neck: Normal range of motion. Neck supple.  Cardiovascular: Normal rate and regular rhythm.   Pulmonary/Chest: Effort normal. No respiratory distress. She has wheezes. She has no rales. She exhibits no tenderness.  Mild end expiratory wheezing  Abdominal: Soft. Bowel sounds are normal. She exhibits no distension and no mass. There is no tenderness. There is no rebound and no guarding.  Musculoskeletal: Normal range of motion. She exhibits no edema or tenderness.  Neurological: She is alert and oriented to person, place, and time.  Moves all extremities without deficit. Sensation is grossly intact.  Skin: Skin is warm and dry. No rash noted. No erythema.  Psychiatric: She has a normal mood and affect. Her behavior is normal.  Nursing note and vitals reviewed.   ED Course  Procedures   DIAGNOSTIC STUDIES: Oxygen Saturation is 97% on RA, adequate by my interpretation.    COORDINATION OF CARE: 1:14 AM Discussed treatment plan with pt at bedside and pt agreed to plan.   Labs Review Labs Reviewed - No data to display  Imaging Review Dg Chest 2 View  12/30/2014   CLINICAL DATA:  Dry cough, shortness of breath and wheezing for 1 week. History of asthma.  EXAM: CHEST  2 VIEW  COMPARISON:  None.  FINDINGS: The heart size and mediastinal contours are within normal limits. Mild bronchitic changes. Both lungs are clear. The visualized skeletal structures are unremarkable. Large body habitus.  IMPRESSION: Mild bronchitic changes without focal consolidation.   Electronically Signed   By: Awilda Metro   On: 12/30/2014 01:24     EKG Interpretation None      MDM   Final diagnoses:  Bronchitis with bronchospasm    I personally performed the services described in this documentation, which was scribed in my presence. The recorded information  has been reviewed and is accurate.  Patient states cough is improved after breathing treatment. Lungs clear no further wheezing. Get short course of steroids discharge home with MDI. Return precautions given.     Loren Racer, MD 12/30/14 [phone]

## 2015-04-18 ENCOUNTER — Emergency Department (HOSPITAL_COMMUNITY): Payer: Medicaid Other

## 2015-04-18 ENCOUNTER — Encounter (HOSPITAL_COMMUNITY): Payer: Self-pay | Admitting: Emergency Medicine

## 2015-04-18 ENCOUNTER — Emergency Department (HOSPITAL_COMMUNITY)
Admission: EM | Admit: 2015-04-18 | Discharge: 2015-04-18 | Disposition: A | Discharge status: Home / Self Care | Payer: Medicaid Other | Attending: Emergency Medicine | Admitting: Emergency Medicine

## 2015-04-18 DIAGNOSIS — R413 Other amnesia: Secondary | ICD-10-CM | POA: Insufficient documentation

## 2015-04-18 DIAGNOSIS — W1839XA Other fall on same level, initial encounter: Secondary | ICD-10-CM | POA: Diagnosis not present

## 2015-04-18 DIAGNOSIS — E119 Type 2 diabetes mellitus without complications: Secondary | ICD-10-CM | POA: Diagnosis not present

## 2015-04-18 DIAGNOSIS — S3992XA Unspecified injury of lower back, initial encounter: Secondary | ICD-10-CM | POA: Diagnosis not present

## 2015-04-18 DIAGNOSIS — Y998 Other external cause status: Secondary | ICD-10-CM | POA: Diagnosis not present

## 2015-04-18 DIAGNOSIS — W19XXXA Unspecified fall, initial encounter: Secondary | ICD-10-CM

## 2015-04-18 DIAGNOSIS — S4991XS Unspecified injury of right shoulder and upper arm, sequela: Secondary | ICD-10-CM | POA: Diagnosis not present

## 2015-04-18 DIAGNOSIS — Y9289 Other specified places as the place of occurrence of the external cause: Secondary | ICD-10-CM | POA: Diagnosis not present

## 2015-04-18 DIAGNOSIS — X58XXXS Exposure to other specified factors, sequela: Secondary | ICD-10-CM | POA: Insufficient documentation

## 2015-04-18 DIAGNOSIS — Y9389 Activity, other specified: Secondary | ICD-10-CM | POA: Insufficient documentation

## 2015-04-18 MED ORDER — IBUPROFEN 800 MG PO TABS: 800.0000 mg | ORAL_TABLET | Freq: Three times a day (TID) | ORAL | Status: DC

## 2015-04-18 NOTE — Discharge Instructions (Signed)
RICE: Routine Care for Injuries The routine care of many injuries includes Rest, Ice, Compression, and Elevation (RICE). HOME CARE INSTRUCTIONS  Rest is needed to allow your body to heal. Routine activities can usually be resumed when comfortable. Injured tendons and bones can take up to 6 weeks to heal. Tendons are the cord-like structures that attach muscle to bone.  Ice following an injury helps keep the swelling down and reduces pain.  Put ice in a plastic bag.  Place a towel between your skin and the bag.  Leave the ice on for 15-20 minutes, 3-4 times a day, or as directed by your health care provider. Do this while awake, for the first 24 to 48 hours. After that, continue as directed by your caregiver.  Compression helps keep swelling down. It also gives support and helps with discomfort. If an elastic bandage has been applied, it should be removed and reapplied every 3 to 4 hours. It should not be applied tightly, but firmly enough to keep swelling down. Watch fingers or toes for swelling, bluish discoloration, coldness, numbness, or excessive pain. If any of these problems occur, remove the bandage and reapply loosely. Contact your caregiver if these problems continue.  Elevation helps reduce swelling and decreases pain. With extremities, such as the arms, hands, legs, and feet, the injured area should be placed near or above the level of the heart, if possible. SEEK IMMEDIATE MEDICAL CARE IF:  You have persistent pain and swelling.  You develop redness, numbness, or unexpected weakness.  Your symptoms are getting worse rather than improving after several days. These symptoms may indicate that further evaluation or further X-rays are needed. Sometimes, X-rays may not show a small broken bone (fracture) until 1 week or 10 days later. Make a follow-up appointment with your caregiver. Ask when your X-ray results will be ready. Make sure you get your X-ray results. Document Released:  12/29/2000 Document Revised: 09/21/2013 Document Reviewed: 02/15/2011 ExitCare Patient Information 2015 [employer]. This information is not intended to replace advice given to you by your health care provider. Make sure you discuss any questions you have with your health care provider.  

## 2015-04-18 NOTE — ED Notes (Signed)
Pt requesting a prescription for stronger pain medication. Greta DoomBowie, PA informed and at bedside speaking with patient regarding this. No new prescription printed. Pt observed ambulating out of ED with steady gait,NAD

## 2015-04-18 NOTE — ED Notes (Signed)
Pt. slipped and fell yesterday at home , reports low back pain and right shoulder pain , no LOC / ambulatory , denies urinary discomfort or hematuria .

## 2015-04-18 NOTE — ED Provider Notes (Signed)
CSN: [id number]     Arrival date & time 04/18/15  1902 History  This chart was scribed for Debra Helper, PA-C, working with Vanetta Mulders, MD by Octavia Heir, ED Scribe. This patient was seen in room TR01C/TR01C and the patient's care was started at 9:09 PM.    Chief Complaint  Patient presents with  . Fall      The history is provided by the patient. No language interpreter was used.   HPI Comments: TIAJAH Miles is a 19 y.o. female who presents to the Emergency Department complaining of lower back pain onset last night. Pt notes slipping and falling on wet ground last night injuring her lower back. Pt notes amnesia about the fall and it unsure if she hit her head. Pt has associated right shoulder pain onset one month ago due to a torn rotator cuff. She is unknown if she injured her right shoulder again from the fall. Pt notes taking OTC ibuprofen to alleviate the pain with no relief. She plans on going to get her shoulder checked tomorrow by her orthopedist She denies headache, numbness in legs, bladder/bowel incontinence.   Past Medical History  Diagnosis Date  . Diabetes    Past Surgical History  Procedure Laterality Date  . Wisdom tooth extraction     Family History  Problem Relation Age of Onset  . Hyperthyroidism Mother   . Hypertension Father   . Hyperthyroidism Brother   . Diabetes Paternal Aunt   . Cancer Maternal Grandmother   . Diabetes Paternal Grandmother   . Diabetes Paternal Grandfather   . Heart disease Paternal Grandfather   . Kidney disease Paternal Grandfather   . Hypertension Paternal Grandfather    History  Substance Use Topics  . Smoking status: Never Smoker   . Smokeless tobacco: Not on file  . Alcohol Use: No   OB History    Gravida Para Term Preterm AB TAB SAB Ectopic Multiple Living   0              Review of Systems  Constitutional: Negative for fever.  Musculoskeletal: Positive for arthralgias.  Skin: Negative for rash and wound.   Neurological: Negative for numbness.      Allergies  Review of patient's allergies indicates no known allergies.  Home Medications   Prior to Admission medications   Medication Sig Start Date End Date Taking? Authorizing Provider  benzonatate (TESSALON) 100 MG capsule Take 1 capsule (100 mg total) by mouth every 8 (eight) hours. 12/30/14   Loren Racer, MD  cephALEXin (KEFLEX) 500 MG capsule Take 1 capsule (500 mg total) by mouth 4 (four) times daily. Patient not taking: Reported on 11/20/2014 01/04/14   Duane Lope, NP  fluconazole (DIFLUCAN) 150 MG tablet Take 1 tablet (150 mg total) by mouth daily. Take 1 pill on 4/10; take 1 pill on 4/14 Patient not taking: Reported on 11/20/2014 01/04/14   Duane Lope, NP  HYDROcodone-acetaminophen (NORCO) 5-325 MG per tablet Take 1-2 tablets by mouth every 6 (six) hours as needed. 11/20/14   Doug Sou, MD  metFORMIN (GLUCOPHAGE) 1000 MG tablet Take 1 tablet (1,000 mg total) by mouth 2 (two) times daily with a meal. 01/04/14   Duane Lope, NP  predniSONE (DELTASONE) 20 MG tablet 3 tabs po day one, then 2 po daily x 4 days 12/30/14   Loren Racer, MD   Triage vitals: BP 132/94 mmHg  Pulse 80  Temp(Src) 98.6 F (37 C) (Oral)  Resp 18  SpO2 98%  LMP 04/13/2015 Physical Exam  Constitutional: She appears well-developed and well-nourished. No distress.  HENT:  Head: Normocephalic and atraumatic.  Eyes: Conjunctivae are normal. Pupils are equal, round, and reactive to light.  Neck: Neck supple.  Cardiovascular: Normal rate, regular rhythm, normal heart sounds and intact distal pulses.   Pulmonary/Chest: Effort normal and breath sounds normal. No respiratory distress.  Abdominal: Soft. There is no tenderness.  Musculoskeletal:  Lower lumbar spine tenderness, No crepitus, no step offs, perilumbar spine tenderness, decreased back flexion and extension, normal rotation  R shoulder: decrease shouder elevation and internal rotation. No  deformity or crepitus.    Lymphadenopathy:    She has no cervical adenopathy.  Neurological: She is alert. Coordination normal.  Skin: Skin is warm and dry. No rash noted. She is not diaphoretic.  Psychiatric: She has a normal mood and affect. Her behavior is normal.  Nursing note and vitals reviewed.   ED Course  Procedures  DIAGNOSTIC STUDIES: Oxygen Saturation is 98% on RA, normal by my interpretation.  COORDINATION OF CARE: 9:12 PM Discussed treatment plan which includes x-ray of spine with pt at bedside and pt agreed to plan.  Labs Review Labs Reviewed - No data to display  Imaging Review Dg Lumbar Spine Complete  04/18/2015   CLINICAL DATA:  Fall yesterday in garage. Right side lower back pain. No prior injuries to back. No pain radiating anywhere else.  EXAM: LUMBAR SPINE - COMPLETE 4+ VIEW  COMPARISON:  None.  FINDINGS: There is no evidence of lumbar spine fracture. Alignment is normal. Intervertebral disc spaces are maintained.  IMPRESSION: Negative.   Electronically Signed   By: Corlis Leak  Hassell M.D.   On: 04/18/2015 21:27     EKG Interpretation None      MDM   Final diagnoses:  Fall  Lower back injury, initial encounter  Right shoulder injury, sequela    BP 132/94 mmHg  Pulse 80  Temp(Src) 98.6 F (37 C) (Oral)  Resp 18  SpO2 98%  LMP 04/13/2015  I personally performed the services described in this documentation, which was scribed in my presence. The recorded information has been reviewed and is accurate.    Debra HelperBowie Shaylon Aden, PA-C 04/18/15 2200  Vanetta MuldersScott Zackowski, MD 04/19/15 [phone]

## 2015-06-20 DIAGNOSIS — Z7952 Long term (current) use of systemic steroids: Secondary | ICD-10-CM | POA: Diagnosis not present

## 2015-06-20 DIAGNOSIS — L03116 Cellulitis of left lower limb: Secondary | ICD-10-CM | POA: Insufficient documentation

## 2015-06-20 DIAGNOSIS — Z791 Long term (current) use of non-steroidal anti-inflammatories (NSAID): Secondary | ICD-10-CM | POA: Insufficient documentation

## 2015-06-20 DIAGNOSIS — Z792 Long term (current) use of antibiotics: Secondary | ICD-10-CM | POA: Diagnosis not present

## 2015-06-20 DIAGNOSIS — E119 Type 2 diabetes mellitus without complications: Secondary | ICD-10-CM | POA: Diagnosis not present

## 2015-06-20 DIAGNOSIS — K122 Cellulitis and abscess of mouth: Secondary | ICD-10-CM | POA: Insufficient documentation

## 2015-06-20 DIAGNOSIS — L02416 Cutaneous abscess of left lower limb: Secondary | ICD-10-CM | POA: Diagnosis present

## 2015-06-20 DIAGNOSIS — Z79899 Other long term (current) drug therapy: Secondary | ICD-10-CM | POA: Insufficient documentation

## 2015-06-20 NOTE — ED Notes (Signed)
Abscess to posterior left thigh, states gets frequent abscesses.

## 2015-06-21 ENCOUNTER — Emergency Department
Admission: EM | Admit: 2015-06-21 | Discharge: 2015-06-21 | Disposition: A | Discharge status: Home / Self Care | Payer: Medicaid Other | Attending: Emergency Medicine | Admitting: Emergency Medicine

## 2015-06-21 DIAGNOSIS — L03116 Cellulitis of left lower limb: Secondary | ICD-10-CM

## 2015-06-21 DIAGNOSIS — L0291 Cutaneous abscess, unspecified: Secondary | ICD-10-CM

## 2015-06-21 MED ORDER — CLINDAMYCIN HCL 300 MG PO CAPS: 300.0000 mg | ORAL_CAPSULE | Freq: Three times a day (TID) | ORAL | Status: DC

## 2015-06-21 MED ORDER — TRAMADOL HCL 50 MG PO TABS: 50.0000 mg | ORAL_TABLET | Freq: Four times a day (QID) | ORAL | Status: DC | PRN

## 2015-06-21 MED ORDER — CLINDAMYCIN HCL 150 MG PO CAPS
300.0000 mg | ORAL_CAPSULE | Freq: Once | ORAL | Status: AC
  Administered 2015-06-21: 300 mg via ORAL
  Filled 2015-06-21: qty 2

## 2015-06-21 MED ORDER — OXYCODONE-ACETAMINOPHEN 5-325 MG PO TABS
1.0000 | ORAL_TABLET | Freq: Once | ORAL | Status: AC
  Administered 2015-06-21: 1 via ORAL
  Filled 2015-06-21: qty 1

## 2015-06-21 NOTE — ED Notes (Signed)

## 2015-06-21 NOTE — ED Provider Notes (Signed)
[facility] Emergency Department Provider Note  ____________________________________________  Time seen: Approximately 4:39 AM  I have reviewed the triage vital signs and the nursing notes.   HISTORY  Chief Complaint Abscess    HPI Debra Miles is a 19 y.o. female who comes in complaining of multiple boils. She reports that she's been getting a lot lately. She reports that they usually rupture on their own or when she soaks him in Epsom salts. The patient reports that she developed one on her left thigh 1 week ago. She reports it has become painful to touch and very sensitive. The patient reports she is diabetic and she is concerned that it may be an infection. The patient has not been taking anything specific for this particular pain but has taken Advil and Aleve occasionally. She reports that her pain as an 8 out of 10 in intensity. She reports also that she had not been taking her metformin and recently restarted as she now has a primary care physician. The patient was concerned so she decided to come in for further evaluation.   Past Medical History  Diagnosis Date  . Diabetes     There are no active problems to display for this patient.   Past Surgical History  Procedure Laterality Date  . Wisdom tooth extraction      Current Outpatient Rx  Name  Route  Sig  Dispense  Refill  . benzonatate (TESSALON) 100 MG capsule   Oral   Take 1 capsule (100 mg total) by mouth every 8 (eight) hours.   21 capsule   0   . cephALEXin (KEFLEX) 500 MG capsule   Oral   Take 1 capsule (500 mg total) by mouth 4 (four) times daily. Patient not taking: Reported on 11/20/2014   28 capsule   0   . clindamycin (CLEOCIN) 300 MG capsule   Oral   Take 1 capsule (300 mg total) by mouth 3 (three) times daily.   30 capsule   0   . fluconazole (DIFLUCAN) 150 MG tablet   Oral   Take 1 tablet (150 mg total) by mouth daily. Take 1 pill on 4/10; take 1 pill on  4/14 Patient not taking: Reported on 11/20/2014   2 tablet   1   . HYDROcodone-acetaminophen (NORCO) 5-325 MG per tablet   Oral   Take 1-2 tablets by mouth every 6 (six) hours as needed.   20 tablet   0   . ibuprofen (ADVIL,MOTRIN) 800 MG tablet   Oral   Take 1 tablet (800 mg total) by mouth 3 (three) times daily.   21 tablet   0   . metFORMIN (GLUCOPHAGE) 1000 MG tablet   Oral   Take 1 tablet (1,000 mg total) by mouth 2 (two) times daily with a meal.   60 tablet   1   . predniSONE (DELTASONE) 20 MG tablet      3 tabs po day one, then 2 po daily x 4 days   11 tablet   0   . traMADol (ULTRAM) 50 MG tablet   Oral   Take 1 tablet (50 mg total) by mouth every 6 (six) hours as needed.   12 tablet   0     Allergies Review of patient's allergies indicates no known allergies.  Family History  Problem Relation Age of Onset  . Hyperthyroidism Mother   . Hypertension Father   . Hyperthyroidism Brother   . Diabetes Paternal Aunt   .  Cancer Maternal Grandmother   . Diabetes Paternal Grandmother   . Diabetes Paternal Grandfather   . Heart disease Paternal Grandfather   . Kidney disease Paternal Grandfather   . Hypertension Paternal Grandfather     Social History Social History  Substance Use Topics  . Smoking status: Never Smoker   . Smokeless tobacco: Not on file  . Alcohol Use: No    Review of Systems Constitutional: No fever/chills Eyes: No visual changes. ENT: No sore throat. Cardiovascular: Denies chest pain. Respiratory: Denies shortness of breath. Gastrointestinal: No abdominal pain.  No nausea, no vomiting.  No diarrhea.  No constipation. Genitourinary: Negative for dysuria. Musculoskeletal: Negative for back pain. Skin: Abscess to left posterior thigh Neurological: Negative for headaches, focal weakness or numbness.  10-point ROS otherwise negative.  ____________________________________________   PHYSICAL EXAM:  VITAL SIGNS: ED Triage Vitals   Enc Vitals Group     BP 06/20/15 2341 156/99 mmHg     Pulse Rate 06/20/15 2340 94     Resp 06/20/15 2340 18     Temp 06/20/15 2340 98.3 F (36.8 C)     Temp Source 06/20/15 2340 Oral     SpO2 06/20/15 2340 98 %     Weight 06/20/15 2340 246 lb (111.585 kg)     Height 06/20/15 2340  (1.651 m)     Head Cir --      Peak Flow --      Pain Score 06/20/15 2340 7     Pain Loc --      Pain Edu? --      Excl. in GC? --     Constitutional: Alert and oriented. Well appearing and in mild distress. Eyes: Conjunctivae are normal. PERRL. EOMI. Head: Atraumatic. Nose: No congestion/rhinnorhea. Mouth/Throat: Mucous membranes are moist.  Oropharynx non-erythematous. Cardiovascular: Normal rate, regular rhythm. Grossly normal heart sounds.  Good peripheral circulation. Respiratory: Normal respiratory effort.  No retractions. Lungs CTAB. Gastrointestinal: Soft and nontender. No distention. Positive bowel sounds Musculoskeletal: No lower extremity tenderness nor edema.   Neurologic:  Normal speech and language.  Skin:  Circular area of induration to left posterior thigh with some erythema and tenderness to palpation no distinct abscess palpated. Second area of redness noticed behind the patient's knee. Psychiatric: Mood and affect are normal.   ____________________________________________   LABS (all labs ordered are listed, but only abnormal results are displayed)  Labs Reviewed - No data to display ____________________________________________  EKG  None ____________________________________________  RADIOLOGY  None ____________________________________________   PROCEDURES  Procedure(s) performed: None  Critical Care performed: No  ____________________________________________   INITIAL IMPRESSION / ASSESSMENT AND PLAN / ED COURSE  Pertinent labs & imaging results that were available during my care of the patient were reviewed by me and considered in my medical decision  making (see chart for details).  This is a 19 year old female who comes in today with an abscess to her left posterior thigh. I did a bedside ultrasound to determine if there was a large fluid collection to drain. The ultrasound showed some phlegmonous changes with no distinct abscess. I will give the patient a dose of clindamycin as well as some Percocet for her pain and discharge her with clindamycin for phlegmonous cellulitis. The patient should follow back up with her primary care physician for further evaluation while she is taking the antibiotics. I did discuss the return precautions with the patient and she agrees with the plan as stated. ____________________________________________   FINAL CLINICAL IMPRESSION(S) / ED  DIAGNOSES  Final diagnoses:  Cellulitis of left lower extremity  Phlegmonous cellulitis      Rebecka Apley, MD 06/21/15 [phone]

## 2015-06-21 NOTE — Discharge Instructions (Signed)
Cellulitis °Cellulitis is an infection of the skin and the tissue beneath it. The infected area is usually red and tender. Cellulitis occurs most often in the arms and lower legs.  °CAUSES  °Cellulitis is caused by bacteria that enter the skin through cracks or cuts in the skin. The most common types of bacteria that cause cellulitis are staphylococci and streptococci. °SIGNS AND SYMPTOMS  °· Redness and warmth. °· Swelling. °· Tenderness or pain. °· Fever. °DIAGNOSIS  °Your health care provider can usually determine what is wrong based on a physical exam. Blood tests may also be done. °TREATMENT  °Treatment usually involves taking an antibiotic medicine. °HOME CARE INSTRUCTIONS  °· Take your antibiotic medicine as directed by your health care provider. Finish the antibiotic even if you start to feel better. °· Keep the infected arm or leg elevated to reduce swelling. °· Apply a warm cloth to the affected area up to 4 times per day to relieve pain. °· Take medicines only as directed by your health care provider. °· Keep all follow-up visits as directed by your health care provider. °SEEK MEDICAL CARE IF:  °· You notice red streaks coming from the infected area. °· Your red area gets larger or turns dark in color. °· Your bone or joint underneath the infected area becomes painful after the skin has healed. °· Your infection returns in the same area or another area. °· You notice a swollen bump in the infected area. °· You develop new symptoms. °· You have a fever. °SEEK IMMEDIATE MEDICAL CARE IF:  °· You feel very sleepy. °· You develop vomiting or diarrhea. °· You have a general ill feeling (malaise) with muscle aches and pains. °MAKE SURE YOU:  °· Understand these instructions. °· Will watch your condition. °· Will get help right away if you are not doing well or get worse. °Document Released: 06/26/2005 Document Revised: 01/31/2014 Document Reviewed: 12/02/2011 °ExitCare® Patient Information ©2015 [employer].  This information is not intended to replace advice given to you by your health care provider. Make sure you discuss any questions you have with your health care provider. ° °Abscess °An abscess is an infected area that contains a collection of pus and debris. It can occur in almost any part of the body. An abscess is also known as a furuncle or boil. °CAUSES  °An abscess occurs when tissue gets infected. This can occur from blockage of oil or sweat glands, infection of hair follicles, or a minor injury to the skin. As the body tries to fight the infection, pus collects in the area and creates pressure under the skin. This pressure causes pain. People with weakened immune systems have difficulty fighting infections and get certain abscesses more often.  °SYMPTOMS °Usually an abscess develops on the skin and becomes a painful mass that is red, warm, and tender. If the abscess forms under the skin, you may feel a moveable soft area under the skin. Some abscesses break open (rupture) on their own, but most will continue to get worse without care. The infection can spread deeper into the body and eventually into the bloodstream, causing you to feel ill.  °DIAGNOSIS  °Your caregiver will take your medical history and perform a physical exam. A sample of fluid may also be taken from the abscess to determine what is causing your infection. °TREATMENT  °Your caregiver may prescribe antibiotic medicines to fight the infection. However, taking antibiotics alone usually does not cure an abscess. Your caregiver may need to   make a small cut (incision) in the abscess to drain the pus. In some cases, gauze is packed into the abscess to reduce pain and to continue draining the area. °HOME CARE INSTRUCTIONS  °· Only take over-the-counter or prescription medicines for pain, discomfort, or fever as directed by your caregiver. °· If you were prescribed antibiotics, take them as directed. Finish them even if you start to feel  better. °· If gauze is used, follow your caregiver's directions for changing the gauze. °· To avoid spreading the infection: °¨ Keep your draining abscess covered with a bandage. °¨ Wash your hands well. °¨ Do not share personal care items, towels, or whirlpools with others. °¨ Avoid skin contact with others. °· Keep your skin and clothes clean around the abscess. °· Keep all follow-up appointments as directed by your caregiver. °SEEK MEDICAL CARE IF:  °· You have increased pain, swelling, redness, fluid drainage, or bleeding. °· You have muscle aches, chills, or a general ill feeling. °· You have a fever. °MAKE SURE YOU:  °· Understand these instructions. °· Will watch your condition. °· Will get help right away if you are not doing well or get worse. °Document Released: 06/26/2005 Document Revised: 03/17/2012 Document Reviewed: 11/29/2011 °ExitCare® Patient Information ©2015 [employer]. This information is not intended to replace advice given to you by your health care provider. Make sure you discuss any questions you have with your health care provider. ° °

## 2015-07-22 ENCOUNTER — Emergency Department
Admission: EM | Admit: 2015-07-22 | Discharge: 2015-07-22 | Disposition: A | Discharge status: Home / Self Care | Payer: Medicaid Other | Attending: Emergency Medicine | Admitting: Emergency Medicine

## 2015-07-22 ENCOUNTER — Encounter: Payer: Self-pay | Admitting: *Deleted

## 2015-07-22 ENCOUNTER — Emergency Department: Payer: Medicaid Other

## 2015-07-22 DIAGNOSIS — W500XXA Accidental hit or strike by another person, initial encounter: Secondary | ICD-10-CM | POA: Diagnosis not present

## 2015-07-22 DIAGNOSIS — Z79899 Other long term (current) drug therapy: Secondary | ICD-10-CM | POA: Diagnosis not present

## 2015-07-22 DIAGNOSIS — G44319 Acute post-traumatic headache, not intractable: Secondary | ICD-10-CM

## 2015-07-22 DIAGNOSIS — Y9389 Activity, other specified: Secondary | ICD-10-CM | POA: Diagnosis not present

## 2015-07-22 DIAGNOSIS — S0990XA Unspecified injury of head, initial encounter: Secondary | ICD-10-CM | POA: Diagnosis present

## 2015-07-22 DIAGNOSIS — Y998 Other external cause status: Secondary | ICD-10-CM | POA: Diagnosis not present

## 2015-07-22 DIAGNOSIS — Y9289 Other specified places as the place of occurrence of the external cause: Secondary | ICD-10-CM | POA: Insufficient documentation

## 2015-07-22 DIAGNOSIS — S0003XA Contusion of scalp, initial encounter: Secondary | ICD-10-CM

## 2015-07-22 MED ORDER — HYDROCODONE-ACETAMINOPHEN 5-325 MG PO TABS
1.0000 | ORAL_TABLET | Freq: Once | ORAL | Status: AC
  Administered 2015-07-22: 1 via ORAL

## 2015-07-22 MED ORDER — HYDROCODONE-ACETAMINOPHEN 5-325 MG PO TABS
ORAL_TABLET | ORAL | Status: AC
  Filled 2015-07-22: qty 1

## 2015-07-22 MED ORDER — HYDROCODONE-ACETAMINOPHEN 5-325 MG PO TABS: 1.0000 | ORAL_TABLET | ORAL | Status: DC | PRN

## 2015-07-22 NOTE — Discharge Instructions (Signed)
Facial or Scalp Contusion  A facial or scalp contusion is a deep bruise on the face or head. Contusions happen when an injury causes bleeding under the skin. Signs of bruising include pain, puffiness (swelling), and discolored skin. The contusion may turn blue, purple, or yellow. HOME CARE  Only take medicines as told by your doctor.  Put ice on the injured area.  Put ice in a plastic bag.  Place a towel between your skin and the bag.  Leave the ice on for 20 minutes, 2-3 times a day. GET HELP IF:  You have bite problems.  You have pain when chewing.  You are worried about your face not healing normally. GET HELP RIGHT AWAY IF:   You have severe pain or a headache and medicine does not help.  You are very tired or confused, or your personality changes.  You throw up (vomit).  You have a nosebleed that will not stop.  You see two of everything (double vision) or have blurry vision.  You have fluid coming from your nose or ear.  You have problems walking or using your arms or legs. MAKE SURE YOU:   Understand these instructions.  Will watch your condition.  Will get help right away if you are not doing well or get worse.   This information is not intended to replace advice given to you by your health care provider. Make sure you discuss any questions you have with your health care provider.   Document Released: 09/05/2011 Document Revised: 10/07/2014 Document Reviewed: 04/29/2013 Elsevier Interactive Patient Education 2016 Elsevier Inc.  Cryotherapy Cryotherapy is when you put ice on your injury. Ice helps lessen pain and puffiness (swelling) after an injury. Ice works the best when you start using it in the first 24 to 48 hours after an injury. HOME CARE  Put a dry or damp towel between the ice pack and your skin.  You may press gently on the ice pack.  Leave the ice on for no more than 10 to 20 minutes at a time.  Check your skin after 5 minutes to make sure  your skin is okay.  Rest at least 20 minutes between ice pack uses.  Stop using ice when your skin loses feeling (numbness).  Do not use ice on someone who cannot tell you when it hurts. This includes small children and people with memory problems (dementia). GET HELP RIGHT AWAY IF:  You have white spots on your skin.  Your skin turns blue or pale.  Your skin feels waxy or hard.  Your puffiness gets worse. MAKE SURE YOU:   Understand these instructions.  Will watch your condition.  Will get help right away if you are not doing well or get worse.   This information is not intended to replace advice given to you by your health care provider. Make sure you discuss any questions you have with your health care provider.   Document Released: 03/04/2008 Document Revised: 12/09/2011 Document Reviewed: 05/09/2011 Elsevier Interactive Patient Education [employer]2016 Else[employer] Inc.

## 2015-07-22 NOTE — ED Provider Notes (Signed)
[facility] Emergency Department Provider Note  ____________________________________________  Time seen: Approximately 7:34 AM  I have reviewed the triage vital signs and the nursing notes.   HISTORY  Chief Complaint Head Injury  HPI GENOVEVA SINGLETON is a 19 y.o. female is here with complaint of headache and dizziness since last evening. Patient states that a family member accidentally shut the trunk lid on her head last evening. She states that she did not lose consciousness but saw "black" for several seconds. She began having headache that is unrelieved with ibuprofen. This morning she complains of right lateral scalp pain. She has had some nausea but no vomiting. Patient rates her head ache and pain as an 8 out of 10. LMP is now   Past Medical History  Diagnosis Date  . Diabetes (HCC)     There are no active problems to display for this patient.   Past Surgical History  Procedure Laterality Date  . Wisdom tooth extraction      Current Outpatient Rx  Name  Route  Sig  Dispense  Refill  . HYDROcodone-acetaminophen (NORCO/VICODIN) 5-325 MG tablet   Oral   Take 1 tablet by mouth every 4 (four) hours as needed for moderate pain.   20 tablet   0   . metFORMIN (GLUCOPHAGE) 1000 MG tablet   Oral   Take 1 tablet (1,000 mg total) by mouth 2 (two) times daily with a meal.   60 tablet   1   . predniSONE (DELTASONE) 20 MG tablet      3 tabs po day one, then 2 po daily x 4 days   11 tablet   0     Allergies Review of patient's allergies indicates no known allergies.  Family History  Problem Relation Age of Onset  . Hyperthyroidism Mother   . Hypertension Father   . Hyperthyroidism Brother   . Diabetes Paternal Aunt   . Cancer Maternal Grandmother   . Diabetes Paternal Grandmother   . Diabetes Paternal Grandfather   . Heart disease Paternal Grandfather   . Kidney disease Paternal Grandfather   . Hypertension Paternal Grandfather      Social History Social History  Substance Use Topics  . Smoking status: Never Smoker   . Smokeless tobacco: None  . Alcohol Use: No    Review of Systems Constitutional: No fever/chills Eyes: No visual changes. ENT: No sore throat. Cardiovascular: Denies chest pain. Respiratory: Denies shortness of breath. Gastrointestinal:   No nausea, no vomiting.  Genitourinary: Negative for dysuria. Musculoskeletal: Negative for back pain. Skin: Negative for rash. Neurological: As in native for headaches, no focal weakness or numbness.  10-point ROS otherwise negative.  ____________________________________________   PHYSICAL EXAM:  VITAL SIGNS: ED Triage Vitals  Enc Vitals Group     BP 07/22/15 0727 139/80 mmHg     Pulse Rate 07/22/15 0727 92     Resp 07/22/15 0727 20     Temp 07/22/15 0727 98.1 F (36.7 C)     Temp Source 07/22/15 0727 Oral     SpO2 07/22/15 0727 98 %     Weight 07/22/15 0727 242 lb (109.77 kg)     Height 07/22/15 0727  (1.651 m)     Head Cir --      Peak Flow --      Pain Score --      Pain Loc --      Pain Edu? --      Excl. in GC? --  Constitutional: Alert and oriented. Well appearing and in no acute distress. Eyes: Conjunctivae are normal. PERRL. EOMI. Head: There is moderate tenderness on palpation of the right lateral scalp. No gross deformity. There is some soft tissue swelling present. Nose: No congestion/rhinnorhea. Neck: No stridor. No cervical tenderness on palpation. Cardiovascular: Normal rate, regular rhythm. Grossly normal heart sounds.  Good peripheral circulation. Respiratory: Normal respiratory effort.  No retractions. Lungs CTAB. Gastrointestinal: Soft and nontender. No distention. Musculoskeletal: Moves upper and lower extremities without any difficulty. Neurologic:  Normal speech and language. No gross focal neurologic deficits are appreciated. No gait instability. Cranial nerves II through XII grossly intact. Grip strength  bilaterally equal. Skin:  Skin is warm, dry and intact. No rash noted. There are some superficial abrasions to the right lateral aspect of the scalp. No active bleeding was noted. Psychiatric: Mood and affect are normal. Speech and behavior are normal.  ____________________________________________   LABS (all labs ordered are listed, but only abnormal results are displayed)  Labs Reviewed - No data to display  RADIOLOGY  CT scan showed no acute changes. There is prominent enlargement of the lateral, third, and fourth ventricles with no evidence of acute cortical infarction, intracranial hemorrhage or mass, midline shift, or extra axial fluid collection. Considered chronic time course per radiologist. ____________________________________________   PROCEDURES  Procedure(s) performed: None  Critical Care performed: No  ____________________________________________   INITIAL IMPRESSION / ASSESSMENT AND PLAN / ED COURSE  Pertinent labs & imaging results that were available during my care of the patient were reviewed by me and considered in my medical decision making (see chart for details).  Patient is to follow[place]up with her doctor in [place]. She still continues to see Dr. Franki Monteeese in [place] who is a pediatrician. She was told that although she has no skull fracture or hemorrhage that her CT was not completely normal and needs to be followed up. Patient was given Norco while in the emergency room for her headache. She is also given a prescription to take home as well. 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

## 2015-07-22 NOTE — ED Notes (Addendum)
Pt states her family member accidentally shut the trunk on her head last night. Reports headache and some dizziness. She said her eyes went black for just a minute. No vomiting.

## 2015-07-22 NOTE — ED Notes (Signed)
Pt hit head on trunk last night, wanted to make sure she doesn't have a concussion, no hematoma or bruises noted on head, no LOC

## 2015-09-01 ENCOUNTER — Ambulatory Visit: Payer: Medicaid Other | Admitting: Cardiovascular Disease

## 2015-09-01 DIAGNOSIS — R0989 Other specified symptoms and signs involving the circulatory and respiratory systems: Secondary | ICD-10-CM

## 2015-09-10 ENCOUNTER — Encounter (HOSPITAL_COMMUNITY): Payer: Self-pay | Admitting: Emergency Medicine

## 2015-09-10 ENCOUNTER — Emergency Department (HOSPITAL_COMMUNITY): Payer: Medicaid Other

## 2015-09-10 ENCOUNTER — Emergency Department (HOSPITAL_COMMUNITY)
Admission: EM | Admit: 2015-09-10 | Discharge: 2015-09-10 | Disposition: A | Discharge status: Home / Self Care | Payer: Medicaid Other | Attending: Emergency Medicine | Admitting: Emergency Medicine

## 2015-09-10 DIAGNOSIS — E119 Type 2 diabetes mellitus without complications: Secondary | ICD-10-CM | POA: Diagnosis not present

## 2015-09-10 DIAGNOSIS — Z79899 Other long term (current) drug therapy: Secondary | ICD-10-CM | POA: Insufficient documentation

## 2015-09-10 DIAGNOSIS — W010XXA Fall on same level from slipping, tripping and stumbling without subsequent striking against object, initial encounter: Secondary | ICD-10-CM | POA: Diagnosis not present

## 2015-09-10 DIAGNOSIS — Y9289 Other specified places as the place of occurrence of the external cause: Secondary | ICD-10-CM | POA: Insufficient documentation

## 2015-09-10 DIAGNOSIS — S6991XA Unspecified injury of right wrist, hand and finger(s), initial encounter: Secondary | ICD-10-CM | POA: Diagnosis present

## 2015-09-10 DIAGNOSIS — Y998 Other external cause status: Secondary | ICD-10-CM | POA: Insufficient documentation

## 2015-09-10 DIAGNOSIS — Z7952 Long term (current) use of systemic steroids: Secondary | ICD-10-CM | POA: Insufficient documentation

## 2015-09-10 DIAGNOSIS — M25531 Pain in right wrist: Secondary | ICD-10-CM

## 2015-09-10 DIAGNOSIS — Y9301 Activity, walking, marching and hiking: Secondary | ICD-10-CM | POA: Insufficient documentation

## 2015-09-10 MED ORDER — IBUPROFEN 800 MG PO TABS
800.0000 mg | ORAL_TABLET | Freq: Once | ORAL | Status: AC
  Administered 2015-09-10: 800 mg via ORAL
  Filled 2015-09-10: qty 1

## 2015-09-10 MED ORDER — HYDROCODONE-ACETAMINOPHEN 5-325 MG PO TABS
2.0000 | ORAL_TABLET | Freq: Once | ORAL | Status: AC
  Administered 2015-09-10: 2 via ORAL
  Filled 2015-09-10: qty 2

## 2015-09-10 MED ORDER — OXYCODONE-ACETAMINOPHEN 5-325 MG PO TABS: 1.0000 | ORAL_TABLET | Freq: Four times a day (QID) | ORAL | Status: DC | PRN

## 2015-09-10 NOTE — ED Notes (Signed)
Ortho paged for thumb spica 

## 2015-09-10 NOTE — ED Notes (Signed)
Patient transported to X-ray 

## 2015-09-10 NOTE — ED Provider Notes (Signed)
CSN: [id number]     Arrival date & time 09/10/15  0542 History   First MD Initiated Contact with Patient 09/10/15 0604     Chief Complaint  Patient presents with  . Fall    hand injury   HPI   Debra Miles is a 19 y.o. PMH significant for DM presenting with right hand and wrist swelling/pain s/p fall this morning. She was walking her dog when she tripped on the dogs leash and fell on the concrete with an outstretched hand. She describes her pain as 8/10 pain scale, radiating through her entire wrist and hand, aching pain, worse with movement. No fevers, chills, head injury, LOC, CP, SOB, N/V, changes in bowel/bladder function.   Past Medical History  Diagnosis Date  . Diabetes [facility])    Past Surgical History  Procedure Laterality Date  . Wisdom tooth extraction     Family History  Problem Relation Age of Onset  . Hyperthyroidism Mother   . Hypertension Father   . Hyperthyroidism Brother   . Diabetes Paternal Aunt   . Cancer Maternal Grandmother   . Diabetes Paternal Grandmother   . Diabetes Paternal Grandfather   . Heart disease Paternal Grandfather   . Kidney disease Paternal Grandfather   . Hypertension Paternal Grandfather    Social History  Substance Use Topics  . Smoking status: Never Smoker   . Smokeless tobacco: None  . Alcohol Use: No   OB History    Gravida Para Term Preterm AB TAB SAB Ectopic Multiple Living   0              Review of Systems  Ten systems are reviewed and are negative for acute change except as noted in the HPI  Allergies  Review of patient's allergies indicates no known allergies.  Home Medications   Prior to Admission medications   Medication Sig Start Date End Date Taking? Authorizing Provider  HYDROcodone-acetaminophen (NORCO/VICODIN) 5-325 MG tablet Take 1 tablet by mouth every 4 (four) hours as needed for moderate pain. 07/22/15   Tommi Rumps, PA-C  metFORMIN (GLUCOPHAGE) 1000 MG tablet Take 1 tablet (1,000 mg total) by  mouth 2 (two) times daily with a meal. 01/04/14   Duane Lope, NP  oxyCODONE-acetaminophen (PERCOCET/ROXICET) 5-325 MG tablet Take 1-2 tablets by mouth every 6 (six) hours as needed for severe pain. 09/10/15   Melton Krebs, PA-C  predniSONE (DELTASONE) 20 MG tablet 3 tabs po day one, then 2 po daily x 4 days 12/30/14   Loren Racer, MD   BP 131/82 mmHg  Pulse 84  Temp(Src) 98.3 F (36.8 C) (Oral)  Resp 18  Ht  (1.651 m)  Wt 106.595 kg  BMI 39.11 kg/m2  SpO2 100%  LMP 09/09/2015 Physical Exam  Constitutional: She appears well-developed and well-nourished. No distress.  HENT:  Head: Normocephalic and atraumatic.  Mouth/Throat: Oropharynx is clear and moist. No oropharyngeal exudate.  Eyes: Conjunctivae are normal. Pupils are equal, round, and reactive to light. Right eye exhibits no discharge. Left eye exhibits no discharge. No scleral icterus.  Neck: No tracheal deviation present.  Cardiovascular: Normal rate, regular rhythm, normal heart sounds and intact distal pulses.  Exam reveals no gallop and no friction rub.   No murmur heard. Pulmonary/Chest: Effort normal and breath sounds normal. No respiratory distress. She has no wheezes. She has no rales. She exhibits no tenderness.  Abdominal: Soft. Bowel sounds are normal. She exhibits no distension and no mass. There is  no tenderness. There is no rebound and no guarding.  Musculoskeletal: Normal range of motion. She exhibits tenderness. She exhibits no edema.  Right hand and wrist diffusely tender without localization of tenderness. Neurovascularly intact bilaterally.  Lymphadenopathy:    She has no cervical adenopathy.  Neurological: She is alert. Coordination normal.  Skin: Skin is warm and dry. No rash noted. She is not diaphoretic. No erythema.  Psychiatric: She has a normal mood and affect. Her behavior is normal.  Nursing note and vitals reviewed.   ED Course  Procedures  Imaging Review Dg Wrist Complete  Right  09/10/2015  CLINICAL DATA:  Fall, injury, pain EXAM: RIGHT WRIST - COMPLETE 3+ VIEW COMPARISON:  09/10/2015 FINDINGS: There is re- demonstration of an ossicle versus bone fragment along the trapezoid and second metacarpal base. There is overlap of the adjacent trapezium. This could represent a secondary ossicle of the trapezoid versus an occult fracture. Right second metacarpal base appears intact. Recommend correlation for point tenderness in this region and if there is further concern consider follow-up nonemergent CT of the wrist. Distal radius and ulna appear intact. No wrist malalignment. IMPRESSION: Small ossicle versus bone fragment along the trapezoid and right second metacarpal base with adjacent overlapping carpal bones. Findings compatible with a carpal bone secondary ossicle versus an occult fracture. See above comment and recommendation Electronically Signed   By: Judie PetitM.  Shick M.D.   On: 09/10/2015 08:33   Dg Hand Complete Right  09/10/2015  CLINICAL DATA:  Pain following fall EXAM: RIGHT HAND - COMPLETE 3+ VIEW COMPARISON:  None. FINDINGS: Frontal, oblique, and lateral views were obtained. There is generalized soft tissue swelling. There is an apparent bony fragment overlying the trapezium bone, likely arising from the lateral proximal most aspect of the second metacarpal. No other evidence of fracture. No dislocation. Joint spaces appear intact. IMPRESSION: Apparent bony fragment overlying the trapezium bone, possibly due to a fracture of the proximal second metacarpal. This area warrants additional imaging with dedicated wrist views to further assess. There is generalized soft tissue swelling. No other evidence suggesting fracture. No dislocation. No appreciable arthropathy. Electronically Signed   By: Bretta BangWilliam  Woodruff III M.D.   On: 09/10/2015 07:02   I have personally reviewed and evaluated these images and lab results as part of my medical decision-making.   MDM   Final  diagnoses:  Wrist pain, acute, right   Patient non-toxic appearing and VSS. Mechanical fall. Hand xray report recommended dedicated wrist views. Wrist xrays demonstrate small ossicle vs bone fragment along trapezoid and right second metacarpal base with adjacent overlapping carpal bones. Because of anatomical snuffbox tenderness, will splint and have patient followup with ortho on an OP basis. Stressed importance of follow-up with patient. Patient feeling better. Patient may be safely discharged home. Discussed reasons for return. Patient to follow-up with ortho. Patient in understanding and agreement with the plan.   Melton KrebsSamantha Nicole Dae Antonucci, PA-C 09/11/15 2303  Margarita Grizzleanielle Ray, MD 09/20/15 [phone]

## 2015-09-10 NOTE — Progress Notes (Signed)
Orthopedic Tech Progress Note Patient Details:  Mickie KayHeather L Remedios 29-Jan-1996 [id number]  Ortho Devices Type of Ortho Device: Thumb velcro splint Ortho Device/Splint Location: rue Ortho Device/Splint Interventions: Application   Dorsey Charette 09/10/2015, 9:59 AM

## 2015-09-10 NOTE — ED Notes (Signed)
Pt placed in a gown and hooked up to the monitor with a BP cuff and pulse ox 

## 2015-09-10 NOTE — ED Notes (Signed)
Pt states she was walking her dog this morning she tripped on the dogs leach and fell hitting her hand on the concrete. Right hand swollen pt c/o 8/10 pain at this time. Family member at the bedside NAD noticed.

## 2015-09-10 NOTE — Discharge Instructions (Signed)
[name],  Nice meeting you! Please follow-up with the hand specialist. Return to the emergency department if your fingers change colors, if you lose sensation in your fingers, if you have increased pain despite pain medcation. Feel better soon!  S. Lane HackerNicole Jontue Crumpacker, PA-C

## 2015-09-10 NOTE — ED Notes (Signed)
Patient returned from X-ray 

## 2015-09-11 ENCOUNTER — Other Ambulatory Visit: Payer: Self-pay | Admitting: Orthopedic Surgery

## 2015-09-11 DIAGNOSIS — M79641 Pain in right hand: Secondary | ICD-10-CM

## 2015-09-12 ENCOUNTER — Other Ambulatory Visit: Payer: Medicaid Other

## 2015-09-13 ENCOUNTER — Encounter: Payer: Self-pay | Admitting: Cardiovascular Disease

## 2015-09-18 ENCOUNTER — Ambulatory Visit
Admission: RE | Admit: 2015-09-18 | Discharge: 2015-09-18 | Disposition: A | Discharge status: Home / Self Care | Payer: Medicaid Other | Source: Ambulatory Visit | Attending: Orthopedic Surgery | Admitting: Orthopedic Surgery

## 2015-09-18 DIAGNOSIS — M79641 Pain in right hand: Secondary | ICD-10-CM

## 2015-10-04 ENCOUNTER — Encounter (HOSPITAL_COMMUNITY): Payer: Self-pay | Admitting: Family Medicine

## 2015-10-04 ENCOUNTER — Emergency Department (HOSPITAL_COMMUNITY)
Admission: EM | Admit: 2015-10-04 | Discharge: 2015-10-04 | Disposition: A | Discharge status: Home / Self Care | Payer: Medicaid Other | Attending: Emergency Medicine | Admitting: Emergency Medicine

## 2015-10-04 ENCOUNTER — Emergency Department (HOSPITAL_COMMUNITY): Payer: Medicaid Other

## 2015-10-04 DIAGNOSIS — R079 Chest pain, unspecified: Secondary | ICD-10-CM | POA: Diagnosis not present

## 2015-10-04 DIAGNOSIS — Z7984 Long term (current) use of oral hypoglycemic drugs: Secondary | ICD-10-CM | POA: Insufficient documentation

## 2015-10-04 DIAGNOSIS — Z7952 Long term (current) use of systemic steroids: Secondary | ICD-10-CM | POA: Insufficient documentation

## 2015-10-04 DIAGNOSIS — E119 Type 2 diabetes mellitus without complications: Secondary | ICD-10-CM | POA: Diagnosis not present

## 2015-10-04 DIAGNOSIS — Z79899 Other long term (current) drug therapy: Secondary | ICD-10-CM | POA: Diagnosis not present

## 2015-10-04 DIAGNOSIS — J45901 Unspecified asthma with (acute) exacerbation: Secondary | ICD-10-CM | POA: Insufficient documentation

## 2015-10-04 DIAGNOSIS — R059 Cough, unspecified: Secondary | ICD-10-CM

## 2015-10-04 DIAGNOSIS — R05 Cough: Secondary | ICD-10-CM

## 2015-10-04 DIAGNOSIS — J209 Acute bronchitis, unspecified: Secondary | ICD-10-CM

## 2015-10-04 MED ORDER — IPRATROPIUM BROMIDE 0.02 % IN SOLN
0.5000 mg | Freq: Once | RESPIRATORY_TRACT | Status: AC
  Administered 2015-10-04: 0.5 mg via RESPIRATORY_TRACT
  Filled 2015-10-04: qty 2.5

## 2015-10-04 MED ORDER — NAPROXEN 250 MG PO TABS: 250.0000 mg | ORAL_TABLET | Freq: Two times a day (BID) | ORAL | Status: DC

## 2015-10-04 MED ORDER — ALBUTEROL SULFATE HFA 108 (90 BASE) MCG/ACT IN AERS: 2.0000 | INHALATION_SPRAY | RESPIRATORY_TRACT | Status: DC | PRN

## 2015-10-04 MED ORDER — ALBUTEROL SULFATE HFA 108 (90 BASE) MCG/ACT IN AERS
2.0000 | INHALATION_SPRAY | Freq: Once | RESPIRATORY_TRACT | Status: AC
  Administered 2015-10-04: 2 via RESPIRATORY_TRACT
  Filled 2015-10-04: qty 6.7

## 2015-10-04 MED ORDER — BENZONATATE 100 MG PO CAPS: 100.0000 mg | ORAL_CAPSULE | Freq: Three times a day (TID) | ORAL | Status: DC

## 2015-10-04 MED ORDER — ALBUTEROL SULFATE (2.5 MG/3ML) 0.083% IN NEBU
5.0000 mg | INHALATION_SOLUTION | Freq: Once | RESPIRATORY_TRACT | Status: AC
Start: 2015-10-04 — End: 2015-10-04
  Administered 2015-10-04: 5 mg via RESPIRATORY_TRACT
  Filled 2015-10-04: qty 6

## 2015-10-04 NOTE — Discharge Instructions (Signed)

## 2015-10-04 NOTE — ED Notes (Signed)
Provider notified of patient's vital signs.

## 2015-10-04 NOTE — ED Notes (Addendum)
Pt here with productive cough. Denies fever. sts SOB when coughing and walking and pain in ribs with coughing.

## 2015-10-04 NOTE — ED Notes (Signed)
See PA assessment 

## 2015-10-04 NOTE — ED Provider Notes (Signed)
CSN: [id number]     Arrival date & time 10/04/15  1333 History  By signing my name below, I, Tanda RockersMargaux Venter, attest that this documentation has been prepared under the direction and in the presence of Everlene FarrierWilliam Laquilla Dault, PA-C. Electronically Signed: Tanda RockersMargaux Venter, ED Scribe. 10/04/2015. 2:04 PM.   Chief Complaint  Patient presents with  . Cough   The history is provided by the patient. No language interpreter was used.     HPI Comments: Debra Miles is a 20 y.o. female with hx asthma who presents to the Emergency Department complaining of gradual onset, constant, productive cough x 4 days. Pt also complains of bilateral rib pain upon coughing, chest tightness, and shortness of breath. Pt took Nyquil last night without relief. She mentions similar symptoms in the past when she had bronchitis. Denies wheezing, chest pain, fever, rhinorrhea, post nasal drip, sore throat, ear pain, rash, syncope, myalgias, or any other associated symptoms. Pt is non smoker. No hx COPD or chronic bronchitis.   Past Medical History  Diagnosis Date  . Diabetes [facility](HCC)    Past Surgical History  Procedure Laterality Date  . Wisdom tooth extraction     Family History  Problem Relation Age of Onset  . Hyperthyroidism Mother   . Hypertension Father   . Hyperthyroidism Brother   . Diabetes Paternal Aunt   . Cancer Maternal Grandmother   . Diabetes Paternal Grandmother   . Diabetes Paternal Grandfather   . Heart disease Paternal Grandfather   . Kidney disease Paternal Grandfather   . Hypertension Paternal Grandfather    Social History  Substance Use Topics  . Smoking status: Never Smoker   . Smokeless tobacco: None  . Alcohol Use: No   OB History    Gravida Para Term Preterm AB TAB SAB Ectopic Multiple Living   0              Review of Systems  Constitutional: Negative for fever and appetite change.  HENT: Negative for ear pain, postnasal drip, rhinorrhea and sore throat.   Eyes: Negative for pain,  itching and visual disturbance.  Respiratory: Positive for cough, chest tightness and shortness of breath. Negative for wheezing.   Cardiovascular: Negative for chest pain and palpitations.  Gastrointestinal: Negative for abdominal pain.  Genitourinary: Negative for dysuria.  Musculoskeletal: Negative for myalgias.  Skin: Negative for rash.  Neurological: Negative for syncope and light-headedness.   Allergies  Review of patient's allergies indicates no known allergies.  Home Medications   Prior to Admission medications   Medication Sig Start Date End Date Taking? Authorizing Provider  albuterol (PROVENTIL HFA;VENTOLIN HFA) 108 (90 Base) MCG/ACT inhaler Inhale 2 puffs into the lungs every 4 (four) hours as needed for wheezing or shortness of breath. 10/04/15   Everlene FarrierWilliam Samaya Boardley, PA-C  benzonatate (TESSALON) 100 MG capsule Take 1 capsule (100 mg total) by mouth every 8 (eight) hours. 10/04/15   Everlene FarrierWilliam Jairus Tonne, PA-C  HYDROcodone-acetaminophen (NORCO/VICODIN) 5-325 MG tablet Take 1 tablet by mouth every 4 (four) hours as needed for moderate pain. 07/22/15   Tommi Rumpshonda L Summers, PA-C  metFORMIN (GLUCOPHAGE) 1000 MG tablet Take 1 tablet (1,000 mg total) by mouth 2 (two) times daily with a meal. 01/04/14   Duane LopeJennifer I Rasch, NP  naproxen (NAPROSYN) 250 MG tablet Take 1 tablet (250 mg total) by mouth 2 (two) times daily with a meal. 10/04/15   Everlene FarrierWilliam Gracen Ringwald, PA-C  oxyCODONE-acetaminophen (PERCOCET/ROXICET) 5-325 MG tablet Take 1-2 tablets by mouth every 6 (six) hours  as needed for severe pain. 09/10/15   Melton Krebs, PA-C  predniSONE (DELTASONE) 20 MG tablet 3 tabs po day one, then 2 po daily x 4 days 12/30/14   Loren Racer, MD   Triage Vitals:  BP 149/106 mmHg  Pulse 96  Temp(Src) 98.4 F (36.9 C) (Oral)  Resp 20  SpO2 97%  LMP 09/16/2015   Physical Exam  Constitutional: She is oriented to person, place, and time. She appears well-developed and well-nourished. No distress.  Nontoxic  appearing.  HENT:  Head: Normocephalic and atraumatic.  Right Ear: External ear normal.  Left Ear: External ear normal.  Nose: Nose normal.  Mouth/Throat: Oropharynx is clear and moist. No oropharyngeal exudate.  Eyes: Conjunctivae are normal. Pupils are equal, round, and reactive to light. Right eye exhibits no discharge. Left eye exhibits no discharge.  Neck: Normal range of motion. Neck supple. No JVD present. No tracheal deviation present.  Cardiovascular: Normal rate, regular rhythm, normal heart sounds and intact distal pulses.  Exam reveals no gallop and no friction rub.   No murmur heard. Pulmonary/Chest: Effort normal. No respiratory distress. She has wheezes. She has no rales.  Scattered wheezes noted bilaterally. No increased work of breathing. Oxygen saturation 100% on room air.  Abdominal: Soft. There is no tenderness. There is no guarding.  Musculoskeletal: She exhibits no edema or tenderness.  Lymphadenopathy:    She has no cervical adenopathy.  Neurological: She is alert and oriented to person, place, and time. Coordination normal.  Skin: Skin is warm and dry. No rash noted. She is not diaphoretic. No erythema. No pallor.  Psychiatric: She has a normal mood and affect. Her behavior is normal.  Nursing note and vitals reviewed.   ED Course  Procedures (including critical care time)  DIAGNOSTIC STUDIES: Oxygen Saturation is 97% on RA, normal by my interpretation.    COORDINATION OF CARE: 2:01 PM-Discussed treatment plan which includes CXR and breathing treatment with pt at bedside and pt agreed to plan.   Labs Review Labs Reviewed - No data to display  Imaging Review Dg Chest 2 View  10/04/2015  CLINICAL DATA:  Chest pain.  Shortness of breath.  Cough. EXAM: CHEST  2 VIEW COMPARISON:  12/30/2014 FINDINGS: The heart size and mediastinal contours are within normal limits. Both lungs are clear. The visualized skeletal structures are unremarkable. IMPRESSION: No active  cardiopulmonary disease. Electronically Signed   By: Gaylyn Rong M.D.   On: 10/04/2015 14:45   I have personally reviewed and evaluated these images as part of my medical decision-making.   EKG Interpretation None      Filed Vitals:   10/04/15 1357 10/04/15 1423 10/04/15 1459 10/04/15 1518  BP: 149/106  161/102 168/105  Pulse: 96  121 127  Temp: 98.4 F (36.9 C)     TempSrc: Oral     Resp: 20  20 20   SpO2: 97% 96% 100% 93%     MDM   Meds given in ED:  Medications  albuterol (PROVENTIL) (2.5 MG/3ML) 0.083% nebulizer solution 5 mg (5 mg Nebulization Given 10/04/15 1419)  ipratropium (ATROVENT) nebulizer solution 0.5 mg (0.5 mg Nebulization Given 10/04/15 1419)  albuterol (PROVENTIL HFA;VENTOLIN HFA) 108 (90 Base) MCG/ACT inhaler 2 puff (2 puffs Inhalation Given 10/04/15 1522)    Discharge Medication List as of 10/04/2015  3:18 PM    START taking these medications   Details  albuterol (PROVENTIL HFA;VENTOLIN HFA) 108 (90 Base) MCG/ACT inhaler Inhale 2 puffs into the lungs every  4 (four) hours as needed for wheezing or shortness of breath., Starting 10/04/2015, Until Discontinued, Print    benzonatate (TESSALON) 100 MG capsule Take 1 capsule (100 mg total) by mouth every 8 (eight) hours., Starting 10/04/2015, Until Discontinued, Print    naproxen (NAPROSYN) 250 MG tablet Take 1 tablet (250 mg total) by mouth 2 (two) times daily with a meal., Starting 10/04/2015, Until Discontinued, Print        Final diagnoses:  Acute bronchitis, unspecified organism   This is a 20 y.o. female with hx asthma who presents to the Emergency Department complaining of gradual onset, constant, productive cough x 4 days. Pt also complains of bilateral rib pain upon coughing, chest tightness, and shortness of breath. Pt took Nyquil last night without relief. She mentions similar symptoms in the past when she had bronchitis.  On exam the patient is afebrile and nontoxic appearing. She has scattered wheezes  noted bilaterally. No increased work of breathing. Oxygen saturation is 100% on room air. Throat is clear. We'll provide the patient with a breathing treatment and check chest x-ray. Chest x-ray shows no active cardiopulmonary disease. At reevaluation after breathing treatment the patient reports feeling much improved. Will discharge with prescription for albuterol inhaler. Her repeat lung exam is improved. Will also discharge her prescriptions for Tessalon Perles and naproxen for her body aches. I discussed strict return precautions. I advised the patient to follow-up with their primary care provider this week. I advised the patient to return to the emergency department with new or worsening symptoms or new concerns. The patient verbalized understanding and agreement with plan.    I personally performed the services described in this documentation, which was scribed in my presence. The recorded information has been reviewed and is accurate.        Everlene Farrier, PA-C 10/04/15 1531  Leta Baptist, MD 10/07/15 [phone]

## 2015-10-04 NOTE — ED Notes (Signed)
Patient transported to X-ray 

## 2016-01-17 ENCOUNTER — Emergency Department
Admission: EM | Admit: 2016-01-17 | Discharge: 2016-01-17 | Disposition: A | Discharge status: Home / Self Care | Payer: Medicaid Other | Attending: Emergency Medicine | Admitting: Emergency Medicine

## 2016-01-17 ENCOUNTER — Encounter: Payer: Self-pay | Admitting: Emergency Medicine

## 2016-01-17 DIAGNOSIS — X58XXXA Exposure to other specified factors, initial encounter: Secondary | ICD-10-CM | POA: Diagnosis not present

## 2016-01-17 DIAGNOSIS — S39012A Strain of muscle, fascia and tendon of lower back, initial encounter: Secondary | ICD-10-CM

## 2016-01-17 DIAGNOSIS — E119 Type 2 diabetes mellitus without complications: Secondary | ICD-10-CM | POA: Diagnosis not present

## 2016-01-17 DIAGNOSIS — Y9259 Other trade areas as the place of occurrence of the external cause: Secondary | ICD-10-CM | POA: Diagnosis not present

## 2016-01-17 DIAGNOSIS — Z79899 Other long term (current) drug therapy: Secondary | ICD-10-CM | POA: Insufficient documentation

## 2016-01-17 DIAGNOSIS — R81 Glycosuria: Secondary | ICD-10-CM | POA: Insufficient documentation

## 2016-01-17 DIAGNOSIS — Y999 Unspecified external cause status: Secondary | ICD-10-CM | POA: Insufficient documentation

## 2016-01-17 DIAGNOSIS — Z7984 Long term (current) use of oral hypoglycemic drugs: Secondary | ICD-10-CM | POA: Insufficient documentation

## 2016-01-17 DIAGNOSIS — Y9389 Activity, other specified: Secondary | ICD-10-CM | POA: Insufficient documentation

## 2016-01-17 DIAGNOSIS — M545 Low back pain: Secondary | ICD-10-CM | POA: Diagnosis present

## 2016-01-17 LAB — URINALYSIS COMPLETE WITH MICROSCOPIC (ARMC ONLY)
BACTERIA UA: NONE SEEN
Bilirubin Urine: NEGATIVE
LEUKOCYTES UA: NEGATIVE
NITRITE: NEGATIVE
PH: 6 (ref 5.0–8.0)
PROTEIN: NEGATIVE mg/dL
SPECIFIC GRAVITY, URINE: 1.018 (ref 1.005–1.030)

## 2016-01-17 MED ORDER — CYCLOBENZAPRINE HCL 10 MG PO TABS: 10.0000 mg | ORAL_TABLET | Freq: Three times a day (TID) | ORAL | Status: DC | PRN

## 2016-01-17 MED ORDER — TRAMADOL HCL 50 MG PO TABS: 50.0000 mg | ORAL_TABLET | Freq: Four times a day (QID) | ORAL | Status: DC | PRN

## 2016-01-17 MED ORDER — NAPROXEN 500 MG PO TABS: 500.0000 mg | ORAL_TABLET | Freq: Two times a day (BID) | ORAL | Status: DC

## 2016-01-17 NOTE — Discharge Instructions (Signed)
You need to schedule an appointment with a primary care provider as soon as possible. You have a large amount of glucose in your urine and you need to start taking your diabetes medications to prevent long term health issues.

## 2016-01-17 NOTE — ED Provider Notes (Signed)
CSN: [id number]     Arrival date & time 01/17/16  [id number] History   First MD Initiated Contact with Patient 01/17/16 [phone]     Chief Complaint  Patient presents with  . Back Pain     HPI   20 year old female who presents emergency department for evaluation of back pain. She states that she's had pain for the past 2 days. She is taking ibuprofen without relief. She works in a [employer], [employer] and packs boxes. She has had no specific injury. Pain is across her lower back, worse on the right side. Pain is sharp at times. She reports occasional dysuria. She is diabetic, but not taking her medications since she aged out of her pediatric provider.   Past Medical History  Diagnosis Date  . Diabetes [facility](HCC)    Past Surgical History  Procedure Laterality Date  . Wisdom tooth extraction     Family History  Problem Relation Age of Onset  . Hyperthyroidism Mother   . Hypertension Father   . Hyperthyroidism Brother   . Diabetes Paternal Aunt   . Cancer Maternal Grandmother   . Diabetes Paternal Grandmother   . Diabetes Paternal Grandfather   . Heart disease Paternal Grandfather   . Kidney disease Paternal Grandfather   . Hypertension Paternal Grandfather    Social History  Substance Use Topics  . Smoking status: Never Smoker   . Smokeless tobacco: None  . Alcohol Use: No   OB History    Gravida Para Term Preterm AB TAB SAB Ectopic Multiple Living   0              Review of Systems  Constitutional: Negative for fever.  Respiratory: Negative for cough.   Gastrointestinal: Negative for nausea, vomiting, diarrhea and constipation.  Genitourinary: Positive for dysuria. Negative for difficulty urinating.  Musculoskeletal: Positive for back pain. Negative for gait problem.  Skin: Negative.   Neurological: Negative for numbness.      Allergies  Review of patient's allergies indicates no known allergies.  Home Medications   Prior to Admission medications   Medication Sig Start Date End  Date Taking? Authorizing Provider  albuterol (PROVENTIL HFA;VENTOLIN HFA) 108 (90 Base) MCG/ACT inhaler Inhale 2 puffs into the lungs every 4 (four) hours as needed for wheezing or shortness of breath. 10/04/15   Everlene FarrierWilliam Dansie, PA-C  benzonatate (TESSALON) 100 MG capsule Take 1 capsule (100 mg total) by mouth every 8 (eight) hours. 10/04/15   Everlene FarrierWilliam Dansie, PA-C  cyclobenzaprine (FLEXERIL) 10 MG tablet Take 1 tablet (10 mg total) by mouth 3 (three) times daily as needed for muscle spasms. 01/17/16   Chinita Pesterari B Leondro Coryell, FNP  metFORMIN (GLUCOPHAGE) 1000 MG tablet Take 1 tablet (1,000 mg total) by mouth 2 (two) times daily with a meal. 01/04/14   Duane LopeJennifer I Rasch, NP  naproxen (NAPROSYN) 500 MG tablet Take 1 tablet (500 mg total) by mouth 2 (two) times daily with a meal. 01/17/16 01/16/17  Breven Guidroz B Airabella Barley, FNP  traMADol (ULTRAM) 50 MG tablet Take 1 tablet (50 mg total) by mouth every 6 (six) hours as needed. 01/17/16   Veta Dambrosia B Arti Trang, FNP   BP 134/82 mmHg  Pulse 88  Temp(Src) 98.2 F (36.8 C) (Oral)  Resp 20  Ht 5\' 5"  (1.651 m)  Wt 111.131 kg  BMI 40.77 kg/m2  SpO2 98%  LMP  (Exact Date) Physical Exam  Constitutional: She is oriented to person, place, and time. She appears well-developed and well-nourished.  HENT:  Head: Normocephalic.  Eyes: Conjunctivae and EOM are normal.  Neck: Normal range of motion.  Cardiovascular: Normal rate and regular rhythm.   Pulmonary/Chest: Effort normal and breath sounds normal.  Musculoskeletal: Normal range of motion.  Neurological: She is alert and oriented to person, place, and time.  Skin: Skin is warm and dry.  Psychiatric: She has a normal mood and affect. Her behavior is normal. Judgment and thought content normal.  Nursing note and vitals reviewed.   ED Course  Procedures (including critical care time) Labs Review Labs Reviewed  URINALYSIS COMPLETEWITH MICROSCOPIC ([facility] ONLY) - Abnormal; Notable for the following:    Color, Urine YELLOW (*)     APPearance CLEAR (*)    Glucose, UA >500 (*)    Ketones, ur TRACE (*)    Hgb urine dipstick 1+ (*)    Squamous Epithelial / LPF 0-5 (*)    All other components within normal limits    Imaging Review No results found. I have personally reviewed and evaluated these images and lab results as part of my medical decision-making.   EKG Interpretation None      MDM   Final diagnoses:  Lumbar strain, initial encounter  Glucosuria    Patient was counseled regarding diabetes management and the results and potential lifelong complications of noncompliance with her medications. She was strongly encouraged to schedule a follow-up appointment with the primary care provider as soon as possible. Today, she will be given prescriptions for Flexeril, Naprosyn, and tramadol for her back pain. She was encouraged to return to the emergency department for symptoms that change or worsen if she is unable to schedule an appointment.    Chinita Pester, FNP 01/17/16 0756  Myrna Blazer, MD 01/17/16 [phone]

## 2016-01-17 NOTE — ED Notes (Signed)
Patient ambulatory to triage with steady gait, without difficulty or distress noted; pt reports lower back pain x 2 days; denies any injuries, denies any accomp symptoms; denies hx of same

## 2016-02-24 ENCOUNTER — Encounter (HOSPITAL_COMMUNITY): Payer: Self-pay

## 2016-02-24 ENCOUNTER — Emergency Department (HOSPITAL_COMMUNITY)
Admission: EM | Admit: 2016-02-24 | Discharge: 2016-02-24 | Disposition: A | Discharge status: Home / Self Care | Payer: Medicaid Other | Attending: Emergency Medicine | Admitting: Emergency Medicine

## 2016-02-24 DIAGNOSIS — R81 Glycosuria: Secondary | ICD-10-CM

## 2016-02-24 DIAGNOSIS — Z7984 Long term (current) use of oral hypoglycemic drugs: Secondary | ICD-10-CM | POA: Insufficient documentation

## 2016-02-24 DIAGNOSIS — X58XXXD Exposure to other specified factors, subsequent encounter: Secondary | ICD-10-CM | POA: Insufficient documentation

## 2016-02-24 DIAGNOSIS — M545 Low back pain: Secondary | ICD-10-CM | POA: Diagnosis present

## 2016-02-24 DIAGNOSIS — S39012D Strain of muscle, fascia and tendon of lower back, subsequent encounter: Secondary | ICD-10-CM

## 2016-02-24 DIAGNOSIS — Z791 Long term (current) use of non-steroidal anti-inflammatories (NSAID): Secondary | ICD-10-CM | POA: Insufficient documentation

## 2016-02-24 DIAGNOSIS — E669 Obesity, unspecified: Secondary | ICD-10-CM | POA: Diagnosis not present

## 2016-02-24 DIAGNOSIS — Z79899 Other long term (current) drug therapy: Secondary | ICD-10-CM | POA: Insufficient documentation

## 2016-02-24 DIAGNOSIS — E119 Type 2 diabetes mellitus without complications: Secondary | ICD-10-CM | POA: Diagnosis not present

## 2016-02-24 LAB — URINALYSIS, ROUTINE W REFLEX MICROSCOPIC
Bilirubin Urine: NEGATIVE
Glucose, UA: 500 mg/dL — AB
Hgb urine dipstick: NEGATIVE
Ketones, ur: 15 mg/dL — AB
Leukocytes, UA: NEGATIVE
Nitrite: NEGATIVE
Protein, ur: 30 mg/dL — AB
Specific Gravity, Urine: 1.031 — ABNORMAL HIGH (ref 1.005–1.030)
pH: 8.5 — ABNORMAL HIGH (ref 5.0–8.0)

## 2016-02-24 LAB — URINE MICROSCOPIC-ADD ON

## 2016-02-24 LAB — CBG MONITORING, ED: GLUCOSE-CAPILLARY: 216 mg/dL — AB (ref 65–99)

## 2016-02-24 LAB — I-STAT CHEM 8, ED
BUN: 6 mg/dL (ref 6–20)
Calcium, Ion: 1.17 mmol/L (ref 1.12–1.23)
Chloride: 102 mmol/L (ref 101–111)
Creatinine, Ser: 0.4 mg/dL — ABNORMAL LOW (ref 0.44–1.00)
Glucose, Bld: 176 mg/dL — ABNORMAL HIGH (ref 65–99)
HCT: 45 % (ref 36.0–46.0)
Hemoglobin: 15.3 g/dL — ABNORMAL HIGH (ref 12.0–15.0)
Potassium: 4.1 mmol/L (ref 3.5–5.1)
Sodium: 140 mmol/L (ref 135–145)
TCO2: 24 mmol/L (ref 0–100)

## 2016-02-24 MED ORDER — SODIUM CHLORIDE 0.9 % IV BOLUS (SEPSIS)
1000.0000 mL | Freq: Once | INTRAVENOUS | Status: AC
  Administered 2016-02-24: 1000 mL via INTRAVENOUS

## 2016-02-24 MED ORDER — METFORMIN HCL 500 MG PO TABS: 500.0000 mg | ORAL_TABLET | Freq: Two times a day (BID) | ORAL | Status: DC

## 2016-02-24 MED ORDER — NAPROXEN 500 MG PO TABS: 500.0000 mg | ORAL_TABLET | Freq: Two times a day (BID) | ORAL | Status: DC

## 2016-02-24 MED ORDER — KETOROLAC TROMETHAMINE 30 MG/ML IJ SOLN
30.0000 mg | Freq: Once | INTRAMUSCULAR | Status: AC
  Administered 2016-02-24: 30 mg via INTRAVENOUS
  Filled 2016-02-24: qty 1

## 2016-02-24 MED ORDER — METHOCARBAMOL 500 MG PO TABS: 500.0000 mg | ORAL_TABLET | Freq: Two times a day (BID) | ORAL | Status: DC

## 2016-02-24 NOTE — Discharge Instructions (Signed)
Keep scheduled appointment with your new primary care doctor on June 7. Take metformin as prescribed. Continue icing your back at home. Avoid heavy lifting. Take Robaxin as needed for pain. Return to the ED if you experience severe worsening of your pain, loss of control of your bowels or bladder, numbness and tingling in both of your lower extremities, burning with urination, blood in your urine, uncontrollable vomiting.

## 2016-02-24 NOTE — ED Provider Notes (Signed)
CSN: [id number]     Arrival date & time 02/24/16  1525 History   First MD Initiated Contact with Patient 02/24/16 1535     Chief Complaint  Patient presents with  . Back Pain     (Consider location/radiation/quality/duration/timing/severity/associated sxs/prior Treatment) HPI   Debra Miles is a 20 y.o F with a pmhx of DM who presents to the ED today c/o back pain. Pt states that for the last month she has been experiencing intermittent left sided back pain. Pt states that "sometimes it just feels like someone is stabbing me in my back". No aggravating or alleviating factors noted. Pt states that she lifts boxes daily at work. Pain not increased with work. Pt was seen previously for this 1 months ago at [facility] And was told it was a muscle strain. She was given muscle relaxers and anti-inflammatories which she states did not improve her symptoms. She has been taking home ibuprofen without relief. She denies saddle anesthesia, bowel or bladder incontinence. She notes that she has had increased polydipsia and feels that her urine is dark. She states she is a diabetic but has not taken her diabetic medication in several months she no longer has a PCP. She denies dysuria, hematuria, fevers, chills, abdominal pain, vaginal discharge.  Past Medical History  Diagnosis Date  . Diabetes [facility](HCC)    Past Surgical History  Procedure Laterality Date  . Wisdom tooth extraction     Family History  Problem Relation Age of Onset  . Hyperthyroidism Mother   . Hypertension Father   . Hyperthyroidism Brother   . Diabetes Paternal Aunt   . Cancer Maternal Grandmother   . Diabetes Paternal Grandmother   . Diabetes Paternal Grandfather   . Heart disease Paternal Grandfather   . Kidney disease Paternal Grandfather   . Hypertension Paternal Grandfather    Social History  Substance Use Topics  . Smoking status: Never Smoker   . Smokeless tobacco: None  . Alcohol Use: No   OB History    Gravida  Para Term Preterm AB TAB SAB Ectopic Multiple Living   0              Review of Systems  All other systems reviewed and are negative.     Allergies  Review of patient's allergies indicates no known allergies.  Home Medications   Prior to Admission medications   Medication Sig Start Date End Date Taking? Authorizing Provider  albuterol (PROVENTIL HFA;VENTOLIN HFA) 108 (90 Base) MCG/ACT inhaler Inhale 2 puffs into the lungs every 4 (four) hours as needed for wheezing or shortness of breath. 10/04/15   Everlene FarrierWilliam Dansie, PA-C  benzonatate (TESSALON) 100 MG capsule Take 1 capsule (100 mg total) by mouth every 8 (eight) hours. 10/04/15   Everlene FarrierWilliam Dansie, PA-C  cyclobenzaprine (FLEXERIL) 10 MG tablet Take 1 tablet (10 mg total) by mouth 3 (three) times daily as needed for muscle spasms. 01/17/16   Chinita Pesterari B Triplett, FNP  metFORMIN (GLUCOPHAGE) 1000 MG tablet Take 1 tablet (1,000 mg total) by mouth 2 (two) times daily with a meal. 01/04/14   Duane LopeJennifer I Rasch, NP  naproxen (NAPROSYN) 500 MG tablet Take 1 tablet (500 mg total) by mouth 2 (two) times daily with a meal. 01/17/16 01/16/17  Cari B Triplett, FNP  traMADol (ULTRAM) 50 MG tablet Take 1 tablet (50 mg total) by mouth every 6 (six) hours as needed. 01/17/16   Cari B Triplett, FNP   BP 142/106 mmHg  Pulse 88  Temp(Src) 98.7 F (37.1 C) (Oral)  Resp 14  Ht  (1.651 m)  Wt 107.673 kg  BMI 39.50 kg/m2  SpO2 97%  LMP 02/14/2016 Physical Exam  Constitutional: She is oriented to person, place, and time. She appears well-developed and well-nourished. No distress.  Obese  HENT:  Head: Normocephalic and atraumatic.  Mouth/Throat: No oropharyngeal exudate.  Eyes: Conjunctivae and EOM are normal. Pupils are equal, round, and reactive to light. Right eye exhibits no discharge. Left eye exhibits no discharge. No scleral icterus.  Cardiovascular: Normal rate, regular rhythm, normal heart sounds and intact distal pulses.  Exam reveals no gallop and no  friction rub.   No murmur heard. Pulmonary/Chest: Effort normal and breath sounds normal. No respiratory distress. She has no wheezes. She has no rales. She exhibits no tenderness.  Abdominal: Soft. Bowel sounds are normal. She exhibits no distension and no mass. There is no tenderness. There is no rebound and no guarding.  Musculoskeletal: Normal range of motion. She exhibits no edema.  No midline spinal tenderness. Full range of motion of seek, T, L-spine. No step-offs or obvious bony deformities. Mild TTP over left lumbar paraspinal muscles. No CVA tenderness.  Neurological: She is alert and oriented to person, place, and time.  Strength 5/5 throughout. No sensory deficits.  No gait abnormality.  Skin: Skin is warm and dry. No rash noted. She is not diaphoretic. No erythema. No pallor.  acanthosis nigricans present on back of neck.  Psychiatric: She has a normal mood and affect. Her behavior is normal.  Nursing note and vitals reviewed.   ED Course  Procedures (including critical care time) Labs Review Labs Reviewed  CBG MONITORING, ED - Abnormal; Notable for the following:    Glucose-Capillary 216 (*)    All other components within normal limits  URINALYSIS, ROUTINE W REFLEX MICROSCOPIC (NOT AT [facility])    Imaging Review No results found. I have personally reviewed and evaluated these images and lab results as part of my medical decision-making.   EKG Interpretation None      MDM   Final diagnoses:  Lumbar strain, subsequent encounter  Glucosuria   20 year old female with history of diabetes presents the ED complaining of ongoing left lumbar pain 1 month. Patient was previously seen at another hospital for same symptoms and was given Flexeril which did not improve her symptoms. On presentation to ED patient appears well, in no apparent distress. All vital signs are stable. She is mild TTP of her left lumbar paraspinal muscles. No CVA tenderness or midline spinal tenderness.  Patient is a diabetic and has not been taking her diabetic medications for several months now. She endorses polydipsia and tea colored urine. No dysuria. UA does not show infection. Doubt pyelonephritis or kidney involvement. No bowel or bladder incontinence, saddle anesthesia. No neurological deficits on exam. No evidence of cauda equina, epidural abscess or other emergent etiology of back pain. Suspect patient's back pain is muscular skeletal in etiology. Ketonuria present, 15 in urine. Elevated specific gravity indicating dehydration. Glucose is 176. Calculated anion gap is 14. No sign of HHS or DKA. Patient was given IV fluids, 1 L bolus. Will DC home on metformin. Patient states she has follow-up with her PCP scheduled for June 7. Will try a course of Robaxin for back muscle pain. Recommend applying ice to the affected area.     Lester Kinsman [place], PA-C 02/24/16 2050  Glynn Octave, MD 02/24/16 2139

## 2016-02-24 NOTE — ED Notes (Signed)
Patient is alert and orientedx4.  Patient was explained discharge instructions and they understood them with no questions.   

## 2016-02-24 NOTE — ED Notes (Addendum)
Patient here with lower back pain x 2 days. denis trauma. Pain worse with ambulation. Took ibuprofen with minimal relief. Reports pain worse on left side. Reports urine darker than usual. Has not had her diabetic meds in months

## 2016-08-30 ENCOUNTER — Encounter: Payer: Self-pay | Admitting: Emergency Medicine

## 2016-08-30 ENCOUNTER — Emergency Department
Admission: EM | Admit: 2016-08-30 | Discharge: 2016-08-30 | Disposition: A | Discharge status: Home / Self Care | Payer: Medicaid Other | Attending: Emergency Medicine | Admitting: Emergency Medicine

## 2016-08-30 DIAGNOSIS — E119 Type 2 diabetes mellitus without complications: Secondary | ICD-10-CM | POA: Insufficient documentation

## 2016-08-30 DIAGNOSIS — Z7984 Long term (current) use of oral hypoglycemic drugs: Secondary | ICD-10-CM | POA: Insufficient documentation

## 2016-08-30 DIAGNOSIS — M5412 Radiculopathy, cervical region: Secondary | ICD-10-CM | POA: Insufficient documentation

## 2016-08-30 DIAGNOSIS — Z79899 Other long term (current) drug therapy: Secondary | ICD-10-CM | POA: Insufficient documentation

## 2016-08-30 MED ORDER — NAPROXEN 500 MG PO TABS: 500.0000 mg | ORAL_TABLET | Freq: Two times a day (BID) | ORAL | 0 refills | Status: DC

## 2016-08-30 NOTE — ED Triage Notes (Signed)
Patient presents to the ED with right arm pain x 3 days.  Patient denies known injury.  Patient is able to move her right arm but states if she lets it "hang down at my side, it'll go numb."  Patient is in no obvious distress at this time.  No swelling or redness noted to arm.

## 2016-08-30 NOTE — Discharge Instructions (Signed)
Please take medications as prescribed and return to the emergency department for any worsening symptoms or urgent changes in your health.

## 2016-08-30 NOTE — ED Provider Notes (Signed)
[facility]-EMERGENCY DEPARTMENT Provider Note   CSN: [id number] Arrival date & time: 08/30/16  1613     History   Chief Complaint Chief Complaint  Patient presents with  . Arm Pain    HPI Debra Miles is a 20 y.o. female presents to the emergency department for evaluation of right arm pain. Patient states for 3 days she has had numbness starting in her neck going into the shoulder, triceps ulnar aspect of the forearm and into the last 2 digits of her right hand. Pain has been constant. She denies any trauma or injury. She believes pain developed after awakening one morning. She has tried topical Voltaren gel in no improvement. She is not taking any oral medications. Pain is moderate. She denies any arm pain with shoulder range of motion. She denies any arm pain with neck range of motion. She has history diabetes with PCOS.  HPI  Past Medical History:  Diagnosis Date  . Diabetes (HCC)     There are no active problems to display for this patient.   Past Surgical History:  Procedure Laterality Date  . WISDOM TOOTH EXTRACTION      OB History    Gravida Para Term Preterm AB Living   0             SAB TAB Ectopic Multiple Live Births                   Home Medications    Prior to Admission medications   Medication Sig Start Date End Date Taking? Authorizing Provider  albuterol (PROVENTIL HFA;VENTOLIN HFA) 108 (90 Base) MCG/ACT inhaler Inhale 2 puffs into the lungs every 4 (four) hours as needed for wheezing or shortness of breath. 10/04/15   Everlene FarrierWilliam Dansie, PA-C  benzonatate (TESSALON) 100 MG capsule Take 1 capsule (100 mg total) by mouth every 8 (eight) hours. 10/04/15   Everlene FarrierWilliam Dansie, PA-C  cyclobenzaprine (FLEXERIL) 10 MG tablet Take 1 tablet (10 mg total) by mouth 3 (three) times daily as needed for muscle spasms. 01/17/16   Chinita Pesterari B Triplett, FNP  metFORMIN (GLUCOPHAGE) 1000 MG tablet Take 1 tablet (1,000 mg total) by mouth 2 (two) times daily with a meal. 01/04/14   Duane LopeJennifer  I Rasch, NP  metFORMIN (GLUCOPHAGE) 500 MG tablet Take 1 tablet (500 mg total) by mouth 2 (two) times daily with a meal. 02/24/16   Samantha Tripp Dowless, PA-C  methocarbamol (ROBAXIN) 500 MG tablet Take 1 tablet (500 mg total) by mouth 2 (two) times daily. 02/24/16   Samantha Tripp Dowless, PA-C  naproxen (NAPROSYN) 500 MG tablet Take 1 tablet (500 mg total) by mouth 2 (two) times daily with a meal. 08/30/16   Evon Slackhomas C Gaines, PA-C  traMADol (ULTRAM) 50 MG tablet Take 1 tablet (50 mg total) by mouth every 6 (six) hours as needed. 01/17/16   Chinita Pesterari B Triplett, FNP    Family History Family History  Problem Relation Age of Onset  . Hyperthyroidism Mother   . Hypertension Father   . Hyperthyroidism Brother   . Diabetes Paternal Aunt   . Cancer Maternal Grandmother   . Diabetes Paternal Grandmother   . Diabetes Paternal Grandfather   . Heart disease Paternal Grandfather   . Kidney disease Paternal Grandfather   . Hypertension Paternal Grandfather     Social History Social History  Substance Use Topics  . Smoking status: Never Smoker  . Smokeless tobacco: Never Used  . Alcohol use No     Allergies  Patient has no known allergies.   Review of Systems Review of Systems  Constitutional: Negative for activity change, chills, fatigue and fever.  HENT: Negative for congestion, sinus pressure and sore throat.   Eyes: Negative for visual disturbance.  Respiratory: Negative for cough, chest tightness and shortness of breath.   Cardiovascular: Negative for chest pain and leg swelling.  Gastrointestinal: Negative for abdominal pain, diarrhea, nausea and vomiting.  Genitourinary: Negative for dysuria.  Musculoskeletal: Negative for arthralgias, gait problem and neck pain.  Skin: Negative for rash.  Neurological: Positive for numbness. Negative for weakness and headaches.  Hematological: Negative for adenopathy.  Psychiatric/Behavioral: Negative for agitation, behavioral problems and  confusion.     Physical Exam Updated Vital Signs BP 111/86 (BP Location: Left Arm)   Pulse 98   Temp 98.1 F (36.7 C) (Oral)   Resp 18   Ht 5\' 5"  (1.651 m)   Wt 108.9 kg   SpO2 99%   BMI 39.94 kg/m   Physical Exam  Constitutional: She is oriented to person, place, and time. She appears well-developed and well-nourished. No distress.  HENT:  Head: Normocephalic and atraumatic.  Right Ear: External ear normal.  Left Ear: External ear normal.  Eyes: Conjunctivae and EOM are normal. Right eye exhibits no discharge. Left eye exhibits no discharge.  Neck: Normal range of motion. Neck supple.  Negative Spurling's test  Cardiovascular: Normal rate, regular rhythm and normal heart sounds.  Exam reveals no friction rub.   No murmur heard. Pulmonary/Chest: Effort normal and breath sounds normal. No respiratory distress. She has no wheezes. She has no rales.  Musculoskeletal: Normal range of motion. She exhibits no edema.  Cervical Spine: Examination of the cervical spine reveals no bony abnormality, no edema, and no ecchymosis.  There is no step-off.  The patient has full active and passive range of motion of the cervical spine with flexion, extension, and right and left bend with rotation.  There is no crepitus with range of motion exercises.  The patient is non-tender along the spinous process to palpation.  The patient has no paravertebral muscle spasm.  There is no parascapular discomfort.  The patient has a negative axial compression test.  The patient has a negative Spurling test.  The patient has a negative overhead arm test for thoracic outlet syndrome.    Right Upper Extremity: Examination of the right shoulder and arm showed no bony abnormality or edema.  The patient has normal active and passive motion with abduction, flexion, internal rotation, and external rotation.  The patient has no tenderness with motion.  The patient has a negative Hawkins test and a negative impingement  test.  The patient has a negative drop arm test.  The patient is non-tender along the deltoid muscle.  There is no subacromial space tenderness with no AC joint tenderness.  The patient has no instability of the shoulder with anterior-posterior motion.  There is a negative sulcus sign.  The rotator cuff muscle strength is 5/5 with supraspinatus, 5/5 with internal rotation, and 5/5 with external rotation.  There is no crepitus with range of motion activities.      Neurological: She is alert and oriented to person, place, and time.  Skin: Skin is warm and dry.  Psychiatric: She has a normal mood and affect.  Nursing note and vitals reviewed.    ED Treatments / Results  Labs (all labs ordered are listed, but only abnormal results are displayed) Labs Reviewed - No data to display  EKG  EKG Interpretation None       Radiology No results found.  Procedures Procedures (including critical care time)  Medications Ordered in ED Medications - No data to display   Initial Impression / Assessment and Plan / ED Course  I have reviewed the triage vital signs and the nursing notes.  Pertinent labs & imaging results that were available during my care of the patient were reviewed by me and considered in my medical decision making (see chart for details).  Clinical Course     20 year old female with right sided cervical radiculopathy. No trauma or injury, no weakness or neurological deficits. She is placed on naproxen 500 mg twice a day and will follow-up with PCP or orthopedics in 7-10 days.  Final Clinical Impressions(s) / ED Diagnoses   Final diagnoses:  Right cervical radiculopathy    New Prescriptions New Prescriptions   NAPROXEN (NAPROSYN) 500 MG TABLET    Take 1 tablet (500 mg total) by mouth 2 (two) times daily with a meal.     Evon Slack, PA-C 08/30/16 1659    Jennye Moccasin, MD 08/30/16 [phone]

## 2016-09-17 ENCOUNTER — Emergency Department (HOSPITAL_COMMUNITY)
Admission: EM | Admit: 2016-09-17 | Discharge: 2016-09-17 | Disposition: A | Discharge status: Home / Self Care | Payer: Medicaid Other | Attending: Emergency Medicine | Admitting: Emergency Medicine

## 2016-09-17 ENCOUNTER — Encounter (HOSPITAL_COMMUNITY): Payer: Self-pay

## 2016-09-17 DIAGNOSIS — B372 Candidiasis of skin and nail: Secondary | ICD-10-CM | POA: Insufficient documentation

## 2016-09-17 DIAGNOSIS — I10 Essential (primary) hypertension: Secondary | ICD-10-CM | POA: Insufficient documentation

## 2016-09-17 DIAGNOSIS — L02415 Cutaneous abscess of right lower limb: Secondary | ICD-10-CM | POA: Insufficient documentation

## 2016-09-17 DIAGNOSIS — L0291 Cutaneous abscess, unspecified: Secondary | ICD-10-CM

## 2016-09-17 DIAGNOSIS — Z79899 Other long term (current) drug therapy: Secondary | ICD-10-CM | POA: Insufficient documentation

## 2016-09-17 DIAGNOSIS — R739 Hyperglycemia, unspecified: Secondary | ICD-10-CM

## 2016-09-17 DIAGNOSIS — Z7984 Long term (current) use of oral hypoglycemic drugs: Secondary | ICD-10-CM | POA: Insufficient documentation

## 2016-09-17 DIAGNOSIS — E1165 Type 2 diabetes mellitus with hyperglycemia: Secondary | ICD-10-CM | POA: Insufficient documentation

## 2016-09-17 LAB — CBG MONITORING, ED: GLUCOSE-CAPILLARY: 192 mg/dL — AB (ref 65–99)

## 2016-09-17 MED ORDER — METFORMIN HCL 500 MG PO TABS: 500.0000 mg | ORAL_TABLET | Freq: Two times a day (BID) | ORAL | 1 refills | Status: DC

## 2016-09-17 MED ORDER — CEPHALEXIN 500 MG PO CAPS: ORAL_CAPSULE | ORAL | 0 refills | Status: DC

## 2016-09-17 MED ORDER — LIDOCAINE HCL (PF) 1 % IJ SOLN
5.0000 mL | Freq: Once | INTRAMUSCULAR | Status: AC
  Administered 2016-09-17: 5 mL via INTRADERMAL
  Filled 2016-09-17: qty 5

## 2016-09-17 MED ORDER — KETOCONAZOLE 2 % EX CREA: 1.0000 "application " | TOPICAL_CREAM | Freq: Every day | CUTANEOUS | 0 refills | Status: DC

## 2016-09-17 MED ORDER — FLUCONAZOLE 150 MG PO TABS: 150.0000 mg | ORAL_TABLET | Freq: Once | ORAL | 0 refills | Status: AC

## 2016-09-17 MED ORDER — SODIUM BICARBONATE 4 % IV SOLN
5.0000 mL | Freq: Once | INTRAVENOUS | Status: AC
Start: 2016-09-17 — End: 2016-09-17
  Administered 2016-09-17: 5 mL via INTRAVENOUS
  Filled 2016-09-17: qty 5

## 2016-09-17 NOTE — ED Notes (Signed)
ED Provider at bedside. 

## 2016-09-17 NOTE — Discharge Instructions (Signed)
Return to the emergency room for wound check in 48 hours if your pain and swelling are worsening.   If you develop fever, have vomiting or if the swelling and redness starts spreading , return to the emergency room immediately for a recheck.   Take your antibiotics as directed and to completion. You should never have any leftover antibiotics! Drink plenty and fluids and stay well hydrated.   Any antibiotic use can reduce the efficacy of hormonal birth control. Please use back up method of contraception.

## 2016-09-17 NOTE — ED Triage Notes (Signed)
Per Pt, Pt is coming from home with complaints of rash noted to abdomen and buttocks. Pt reports her aunt was diagnosed with scabies two weeks ago and she is worried it could be that. Also reports abscess noted to the right lower leg that started a couple days ago. Pt tried to \\lance  it herself, and reports the erythema has gotten worse since then.

## 2016-09-17 NOTE — ED Provider Notes (Signed)
[facility]-EMERGENCY DEPT Provider Note   CSN: [id number] Arrival date & time: 09/17/16  4098     History   Chief Complaint Chief Complaint  Patient presents with  . Rash  . Abscess    HPI Debra Miles is a 20 y.o. female with a pmh of PCOS and DM type 2 who presents wit c/o rash and abscess. Patient has a boil on the R ankle. She states that she had a small pimple on her r ankle popped 3 days ago. She states that it has become more painful, red, extremely tender. She thinks she has been running fevers at home. She also complains of a rash that has developed over the past several weeks, under her breasts. She states that it is extremely itchy. She denies any tenderness or drainage from underneath the breasts. The patient has had history of diabetes, but lost her insurance and she turned 20. She has not been able to follow up with her physician and has not been taking any medications to treat this. She is also noted to be hypertensive in the emergency room today. She denies chest pain, shortness of breath, dizziness, lightheadedness. She's been taking over-the-counter homeopathic treatments for prevention of UTI and yeast infections.  HPI  Past Medical History:  Diagnosis Date  . Diabetes (HCC)     There are no active problems to display for this patient.   Past Surgical History:  Procedure Laterality Date  . WISDOM TOOTH EXTRACTION      OB History    Gravida Para Term Preterm AB Living   0             SAB TAB Ectopic Multiple Live Births                   Home Medications    Prior to Admission medications   Medication Sig Start Date End Date Taking? Authorizing Provider  albuterol (PROVENTIL HFA;VENTOLIN HFA) 108 (90 Base) MCG/ACT inhaler Inhale 2 puffs into the lungs every 4 (four) hours as needed for wheezing or shortness of breath. 10/04/15   Everlene Farrier, PA-C  benzonatate (TESSALON) 100 MG capsule Take 1 capsule (100 mg total) by mouth every 8 (eight) hours. 10/04/15    Everlene Farrier, PA-C  cyclobenzaprine (FLEXERIL) 10 MG tablet Take 1 tablet (10 mg total) by mouth 3 (three) times daily as needed for muscle spasms. 01/17/16   Chinita Pester, FNP  metFORMIN (GLUCOPHAGE) 1000 MG tablet Take 1 tablet (1,000 mg total) by mouth 2 (two) times daily with a meal. 01/04/14   Duane Lope, NP  metFORMIN (GLUCOPHAGE) 500 MG tablet Take 1 tablet (500 mg total) by mouth 2 (two) times daily with a meal. 02/24/16   Samantha Tripp Dowless, PA-C  methocarbamol (ROBAXIN) 500 MG tablet Take 1 tablet (500 mg total) by mouth 2 (two) times daily. 02/24/16   Samantha Tripp Dowless, PA-C  naproxen (NAPROSYN) 500 MG tablet Take 1 tablet (500 mg total) by mouth 2 (two) times daily with a meal. 08/30/16   Evon Slack, PA-C  traMADol (ULTRAM) 50 MG tablet Take 1 tablet (50 mg total) by mouth every 6 (six) hours as needed. 01/17/16   Chinita Pester, FNP    Family History Family History  Problem Relation Age of Onset  . Hyperthyroidism Mother   . Hypertension Father   . Hyperthyroidism Brother   . Diabetes Paternal Aunt   . Cancer Maternal Grandmother   . Diabetes Paternal Grandmother   .  Diabetes Paternal Grandfather   . Heart disease Paternal Grandfather   . Kidney disease Paternal Grandfather   . Hypertension Paternal Grandfather     Social History Social History  Substance Use Topics  . Smoking status: Never Smoker  . Smokeless tobacco: Never Used  . Alcohol use No     Allergies   Patient has no known allergies.   Review of Systems Review of Systems  Constitutional: Positive for chills and fever.  Endocrine: Positive for polyuria.  Musculoskeletal: Negative for myalgias.  Skin: Positive for rash and wound.     Physical Exam Updated Vital Signs BP (!) 148/112 (BP Location: Left Arm)   Pulse 118   Temp 98.3 F (36.8 C) (Oral)   Resp 18   Ht 5\' 5"  (1.651 m)   Wt 110.2 kg   SpO2 98%   BMI 40.44 kg/m   Physical Exam  Constitutional: She is  oriented to person, place, and time. She appears well-developed and well-nourished. No distress.  HENT:  Head: Normocephalic and atraumatic.  Eyes: Conjunctivae are normal. No scleral icterus.  Neck: Normal range of motion.  Cardiovascular: Normal rate, regular rhythm and normal heart sounds.  Exam reveals no gallop and no friction rub.   No murmur heard. Pulmonary/Chest: Effort normal and breath sounds normal. No respiratory distress.  Abdominal: Soft. Bowel sounds are normal. She exhibits no distension and no mass. There is no tenderness. There is no guarding.  Neurological: She is alert and oriented to person, place, and time.  Skin: Skin is warm and dry. She is not diaphoretic.     ED Treatments / Results  Labs (all labs ordered are listed, but only abnormal results are displayed) Labs Reviewed - No data to display  EKG  EKG Interpretation None       Radiology No results found.  Procedures .Marland Kitchen.Incision and Drainage Date/Time: 09/17/2016 10:22 AM Performed by: Arthor CaptainHARRIS, Alexxis Mackert Authorized by: Arthor CaptainHARRIS, Tareek Sabo   Consent:    Consent obtained:  Verbal   Consent given by:  Patient   Risks discussed:  Bleeding, infection and pain Location:    Type:  Abscess   Size:  3cm   Location:  Lower extremity   Lower extremity location:  Leg   Leg location:  R lower leg Pre-procedure details:    Skin preparation:  Betadine Procedure type:    Complexity:  Simple Procedure details:    Needle aspiration: no     Incision types:  Stab incision   Incision depth:  Dermal   Scalpel blade:  11   Wound management:  Probed and deloculated and irrigated with saline   Drainage:  Bloody and purulent   Drainage amount:  Scant   Wound treatment:  Wound left open   Packing materials:  None Post-procedure details:    Patient tolerance of procedure:  Tolerated well, no immediate complications   (including critical care time)  Medications Ordered in ED Medications  lidocaine (PF)  (XYLOCAINE) 1 % injection 5 mL (not administered)  sodium bicarbonate (NEUT) 4 % injection 5 mL (not administered)     Initial Impression / Assessment and Plan / ED Course  I have reviewed the triage vital signs and the nursing notes.  Pertinent labs & imaging results that were available during my care of the patient were reviewed by me and considered in my medical decision making (see chart for details).  Clinical Course    Patient with abscess to the right lower extremity. Incision and drainage performed. The  patient will be discharged with Keflex. She also has candidal intertrigo under the bilateral breasts to be treated with ketoconazole cream. I have referred the patient via care management to the sickle cell clinic. She has an appointment on January 8 for follow-up on her diabetes and blood pressure. Patient will be discharged with metformin, and Diflucan for potential yeast infection after her antibiotics. Given her poorly controlled diabetes. She is to follow up in 2 days for any worsening symptoms. She appears safe for discharge at this time I personally performed the services described in this documentation, which was scribed in my presence. The recorded information has been reviewed and is accurate.     Final Clinical Impressions(s) / ED Diagnoses   Final diagnoses:  Abscess  Hyperglycemia  Candidal intertrigo  Hypertension, unspecified type    New Prescriptions New Prescriptions   No medications on file     Arthor Captainbigail Jailene Cupit, PA-C 09/17/16 1024    Derwood KaplanAnkit Nanavati, MD 09/17/16 2330

## 2016-09-17 NOTE — Care Management Note (Signed)
Case Management Note  Patient Details  Name: Debra Miles MRN: [id number] Date of Birth: 12-Aug-1996  Subjective/Objective:                  20 y.o. female with a pmh of PCOS and DM type 2 who presents wit c/o rash and abscess. From home.  Action/Plan: Follow for disposition needs. /Discharge home with self care, [facility]   Expected Discharge Date:  09/17/16               Expected Discharge Plan:  Home/Self Care  In-House Referral:  NA  Discharge planning Services  CM Consult, Follow-up appt scheduled, [facility], Medication Assistance  Post Acute Care Choice:  NA Choice offered to:  Patient  DME Arranged:  N/A DME Agency:  NA  HH Arranged:  NA HH Agency:  NA  Status of Service:  Completed, signed off  Additional Comments: Debra Mcinerny J. Lucretia RoersWood, RN, BSN, [place]CM [phone] [facility] set up appointment with Concepcion LivingLinda Bernhardt, NP on January 8 @ 2:00.  Spoke with pt at bedside and advised to please arrive 15 min early and take a picture ID and your current medications.  Pt verbalizes understanding of keeping appointment.  Debra Miles, Debra Crisp, RN 09/17/2016, 10:11 AM

## 2016-09-19 LAB — AEROBIC CULTURE W GRAM STAIN (SUPERFICIAL SPECIMEN)

## 2016-09-19 LAB — AEROBIC CULTURE  (SUPERFICIAL SPECIMEN)

## 2016-09-20 ENCOUNTER — Telehealth: Payer: Self-pay

## 2016-09-20 NOTE — Telephone Encounter (Signed)
Post ED Visit - Positive Culture Follow-up: Unsuccessful Patient Follow-up  Culture assessed and recommendations reviewed by: []  Enzo BiNathan Batchelder, Pharm.D. []  Celedonio MiyamotoJeremy Frens, Pharm.D., BCPS [x]  Garvin FilaMike Maccia, Pharm.D. []  Georgina PillionElizabeth Martin, Pharm.D., BCPS []  BrentwoodMinh Pham, 17[address] Rainbow BoulevardPharm.D., BCPS, AAHIVP []  Estella HuskMichelle Turner, Pharm.D., BCPS, AAHIVP []  Tennis Mustassie Stewart, Pharm.D. []  Sherle Poeob Vincent, [place].D.  Positive aerobic culture  []  Patient discharged without antimicrobial prescription and treatment is now indicated [x]  Organism is resistant to prescribed ED discharge antimicrobial []  Patient with positive blood cultures   Unable to contact patient after 3 attempts, letter will be sent to address on file  Jerry CarasCullom, Roquel Burgin Burnett 09/20/2016, 10:54 AM

## 2016-09-20 NOTE — Progress Notes (Signed)
ED Antimicrobial Stewardship Positive Culture Follow Up   Debra Miles is an 20 y.o. female [facility] with a chief complaint of  Chief Complaint  Patient presents with  . Rash  . Abscess    Recent Results (from the past 720 hour(s))  Wound or Superficial Culture     Status: None   Collection Time: 09/17/16  9:52 AM  Result Value Ref Range Status   Specimen Description ABSCESS RIGHT LOWER LEG  Final   Special Requests Immunocompromised  Final   Gram Stain   Final    FEW WBC PRESENT, PREDOMINANTLY PMN FEW GRAM POSITIVE COCCI IN CLUSTERS    Culture   Final    MODERATE METHICILLIN RESISTANT STAPHYLOCOCCUS AUREUS   Report Status 09/19/2016 FINAL  Final   Organism ID, Bacteria METHICILLIN RESISTANT STAPHYLOCOCCUS AUREUS  Final      Susceptibility   Methicillin resistant staphylococcus aureus - MIC*    CIPROFLOXACIN >=8 RESISTANT Resistant     ERYTHROMYCIN >=8 RESISTANT Resistant     GENTAMICIN <=0.5 SENSITIVE Sensitive     OXACILLIN >=4 RESISTANT Resistant     TETRACYCLINE <=1 SENSITIVE Sensitive     VANCOMYCIN <=0.5 SENSITIVE Sensitive     TRIMETH/SULFA <=10 SENSITIVE Sensitive     CLINDAMYCIN <=0.25 SENSITIVE Sensitive     RIFAMPIN <=0.5 SENSITIVE Sensitive     Inducible Clindamycin NEGATIVE Sensitive     * MODERATE METHICILLIN RESISTANT STAPHYLOCOCCUS AUREUS    [x]  Treated with cephexlin, organism resistant to prescribed antimicrobial []  Patient discharged originally without antimicrobial agent and treatment is now indicated  New antibiotic prescription: bactrim ds 1 tab bid x 1 week  ED Provider: Fayrene HelperBowie Tran, PA-C  Bertram MillardMichael A Tonatiuh Mallon 09/20/2016, 8:21 AM Infectious Diseases Pharmacist Phone# [phone]

## 2016-10-07 ENCOUNTER — Ambulatory Visit: Payer: Medicaid Other | Admitting: Family Medicine

## 2017-02-28 ENCOUNTER — Encounter (HOSPITAL_COMMUNITY): Payer: Self-pay | Admitting: Emergency Medicine

## 2017-02-28 ENCOUNTER — Emergency Department (HOSPITAL_COMMUNITY)
Admission: EM | Admit: 2017-02-28 | Discharge: 2017-02-28 | Disposition: A | Discharge status: Home / Self Care | Payer: Medicaid Other | Attending: Emergency Medicine | Admitting: Emergency Medicine

## 2017-02-28 DIAGNOSIS — E119 Type 2 diabetes mellitus without complications: Secondary | ICD-10-CM | POA: Insufficient documentation

## 2017-02-28 DIAGNOSIS — R1011 Right upper quadrant pain: Secondary | ICD-10-CM | POA: Insufficient documentation

## 2017-02-28 DIAGNOSIS — Z7984 Long term (current) use of oral hypoglycemic drugs: Secondary | ICD-10-CM | POA: Insufficient documentation

## 2017-02-28 DIAGNOSIS — R1013 Epigastric pain: Secondary | ICD-10-CM | POA: Insufficient documentation

## 2017-02-28 DIAGNOSIS — Z79899 Other long term (current) drug therapy: Secondary | ICD-10-CM | POA: Insufficient documentation

## 2017-02-28 DIAGNOSIS — R1012 Left upper quadrant pain: Secondary | ICD-10-CM | POA: Insufficient documentation

## 2017-02-28 LAB — COMPREHENSIVE METABOLIC PANEL
ALBUMIN: 3.4 g/dL — AB (ref 3.5–5.0)
ALK PHOS: 81 U/L (ref 38–126)
ALT: 36 U/L (ref 14–54)
AST: 30 U/L (ref 15–41)
Anion gap: 9 (ref 5–15)
BILIRUBIN TOTAL: 0.4 mg/dL (ref 0.3–1.2)
BUN: 8 mg/dL (ref 6–20)
CALCIUM: 8.5 mg/dL — AB (ref 8.9–10.3)
CO2: 24 mmol/L (ref 22–32)
CREATININE: 0.71 mg/dL (ref 0.44–1.00)
Chloride: 103 mmol/L (ref 101–111)
GFR calc Af Amer: >60 mL/min (ref >60)
GFR calc non Af Amer: >60 mL/min (ref >60)
GLUCOSE: 370 mg/dL — AB (ref 65–99)
Potassium: 3.9 mmol/L (ref 3.5–5.1)
Sodium: 136 mmol/L (ref 135–145)
TOTAL PROTEIN: 6.4 g/dL — AB (ref 6.5–8.1)

## 2017-02-28 LAB — URINALYSIS, ROUTINE W REFLEX MICROSCOPIC
BACTERIA UA: NONE SEEN
BILIRUBIN URINE: NEGATIVE
Glucose, UA: >=500 mg/dL — AB
Ketones, ur: NEGATIVE mg/dL
Leukocytes, UA: NEGATIVE
NITRITE: NEGATIVE
PH: 5 (ref 5.0–8.0)
Protein, ur: NEGATIVE mg/dL
SPECIFIC GRAVITY, URINE: 1.022 (ref 1.005–1.030)

## 2017-02-28 LAB — CBG MONITORING, ED
Glucose-Capillary: 362 mg/dL — ABNORMAL HIGH (ref 65–99)
Glucose-Capillary: 330 mg/dL — ABNORMAL HIGH (ref 65–99)

## 2017-02-28 LAB — CBC
HCT: 42.7 % (ref 36.0–46.0)
Hemoglobin: 14.8 g/dL (ref 12.0–15.0)
MCH: 29.7 pg (ref 26.0–34.0)
MCHC: 34.7 g/dL (ref 30.0–36.0)
MCV: 85.6 fL (ref 78.0–100.0)
PLATELETS: 363 10*3/uL (ref 150–400)
RBC: 4.99 MIL/uL (ref 3.87–5.11)
RDW: 12.6 % (ref 11.5–15.5)
WBC: 10.9 10*3/uL — ABNORMAL HIGH (ref 4.0–10.5)

## 2017-02-28 LAB — LIPASE, BLOOD: Lipase: 26 U/L (ref 11–51)

## 2017-02-28 LAB — PREGNANCY, URINE: PREG TEST UR: NEGATIVE

## 2017-02-28 MED ORDER — METFORMIN HCL 500 MG PO TABS: 500.0000 mg | ORAL_TABLET | Freq: Two times a day (BID) | ORAL | 1 refills | Status: DC

## 2017-02-28 MED ORDER — GI COCKTAIL ~~LOC~~
30.0000 mL | Freq: Once | ORAL | Status: AC
  Administered 2017-02-28: 30 mL via ORAL
  Filled 2017-02-28: qty 30

## 2017-02-28 MED ORDER — ONDANSETRON HCL 4 MG/2ML IJ SOLN
4.0000 mg | Freq: Once | INTRAMUSCULAR | Status: AC
  Administered 2017-02-28: 4 mg via INTRAVENOUS
  Filled 2017-02-28: qty 2

## 2017-02-28 NOTE — ED Provider Notes (Signed)
[facility]-EMERGENCY DEPT Provider Note   CSN: [id number] Arrival date & time: 02/28/17  2045     History   Chief Complaint Chief Complaint  Patient presents with  . Abdominal Pain    HPI Debra Miles is a 21 y.o. female.  HPI  The patient is an obese 21 year old female with a history of diabetes and polycystic ovarian syndrome. She is not currently taking any medications stating that she does not have a family doctor to prescribe them. She reports that over the last 4 days when she eats her meals prior to going to work at night she developed increasing pain in the left upper quadrant with associated significant nausea. It gradually goes away and then is relieved. She has had this with eating fast food, burgers and fries as well as with eating other things such as mashed potatoes and vegetables. She does not have the same symptoms when she eats crackers at work or when she eats cereal when she comes home in the morning. She does work the third shift. She denies any prior abdominal surgical history. She does not have any radiation to the back. It was no other symptoms. She has symptoms at this time. It is mild to moderate, she has some nausea, she was noted to be hypertensive by EMS prehospital. She was given a liter of fluids prehospital as well. Her blood sugar was just over 300.  Past Medical History:  Diagnosis Date  . Diabetes (HCC)     There are no active problems to display for this patient.   Past Surgical History:  Procedure Laterality Date  . WISDOM TOOTH EXTRACTION      OB History    Gravida Para Term Preterm AB Living   0             SAB TAB Ectopic Multiple Live Births                   Home Medications    Prior to Admission medications   Medication Sig Start Date End Date Taking? Authorizing Provider  Phenazopyridine HCl (AZO TABS PO) Take by mouth.   Yes [provider]  albuterol (PROVENTIL HFA;VENTOLIN HFA) 108 (90 Base) MCG/ACT inhaler Inhale 2  puffs into the lungs every 4 (four) hours as needed for wheezing or shortness of breath. Patient not taking: Reported on 02/28/2017 10/04/15   Everlene Farrieransie, William, PA-C  metFORMIN (GLUCOPHAGE) 500 MG tablet Take 1 tablet (500 mg total) by mouth 2 (two) times daily with a meal. 02/28/17   Eber HongMiller, Madyx Delfin, MD    Family History Family History  Problem Relation Age of Onset  . Hyperthyroidism Mother   . Hypertension Father   . Hyperthyroidism Brother   . Diabetes Paternal Aunt   . Cancer Maternal Grandmother   . Diabetes Paternal Grandmother   . Diabetes Paternal Grandfather   . Heart disease Paternal Grandfather   . Kidney disease Paternal Grandfather   . Hypertension Paternal Grandfather     Social History Social History  Substance Use Topics  . Smoking status: Never Smoker  . Smokeless tobacco: Never Used  . Alcohol use No     Allergies   Patient has no known allergies.   Review of Systems Review of Systems  All other systems reviewed and are negative.    Physical Exam Updated Vital Signs BP (!) 144/85 (BP Location: Right Arm)   Pulse (!) 103   Temp 98.2 F (36.8 C) (Oral)   Resp 18  Ht 5\' 5"  (1.651 m)   Wt 111.1 kg (245 lb)   LMP 02/13/2017   SpO2 97%   BMI 40.77 kg/m   Physical Exam  Constitutional: She appears well-developed and well-nourished. No distress.  HENT:  Head: Normocephalic and atraumatic.  Mouth/Throat: Oropharynx is clear and moist. No oropharyngeal exudate.  Eyes: Conjunctivae and EOM are normal. Pupils are equal, round, and reactive to light. Right eye exhibits no discharge. Left eye exhibits no discharge. No scleral icterus.  Neck: Normal range of motion. Neck supple. No JVD present. No thyromegaly present.  Cardiovascular: Normal rate, regular rhythm, normal heart sounds and intact distal pulses.  Exam reveals no gallop and no friction rub.   No murmur heard. Pulmonary/Chest: Effort normal and breath sounds normal. No respiratory distress. She has  no wheezes. She has no rales.  Abdominal: Soft. Bowel sounds are normal. She exhibits no distension and no mass. There is tenderness ( ttp in the upper abd both RUQ and LUQ and epigastrium.  no guarding or peritoneal signs, no ttp in lower abd).  Musculoskeletal: Normal range of motion. She exhibits no edema or tenderness.  Lymphadenopathy:    She has no cervical adenopathy.  Neurological: She is alert. Coordination normal.  Skin: Skin is warm and dry. No rash noted. No erythema.  Psychiatric: She has a normal mood and affect. Her behavior is normal.  Nursing note and vitals reviewed.    ED Treatments / Results  Labs (all labs ordered are listed, but only abnormal results are displayed) Labs Reviewed  COMPREHENSIVE METABOLIC PANEL - Abnormal; Notable for the following:       Result Value   Glucose, Bld 370 (*)    Calcium 8.5 (*)    Total Protein 6.4 (*)    Albumin 3.4 (*)    All other components within normal limits  CBC - Abnormal; Notable for the following:    WBC 10.9 (*)    All other components within normal limits  URINALYSIS, ROUTINE W REFLEX MICROSCOPIC - Abnormal; Notable for the following:    Color, Urine STRAW (*)    Glucose, UA >=500 (*)    Hgb urine dipstick SMALL (*)    Squamous Epithelial / LPF 0-5 (*)    All other components within normal limits  CBG MONITORING, ED - Abnormal; Notable for the following:    Glucose-Capillary 362 (*)    All other components within normal limits  CBG MONITORING, ED - Abnormal; Notable for the following:    Glucose-Capillary 330 (*)    All other components within normal limits  LIPASE, BLOOD  PREGNANCY, URINE    Radiology No results found.  Procedures Procedures (including critical care time)  Medications Ordered in ED Medications  ondansetron (ZOFRAN) injection 4 mg (4 mg Intravenous Given 02/28/17 2156)  gi cocktail (Maalox,Lidocaine,Donnatal) (30 mLs Oral Given 02/28/17 2157)     Initial Impression / Assessment and  Plan / ED Course  I have reviewed the triage vital signs and the nursing notes.  Pertinent labs & imaging results that were available during my care of the patient were reviewed by me and considered in my medical decision making (see chart for details).     Has abd ttp in the upper abd c/w p ossible PUD / gastritis / pancreatitis / chole Labs pending,   Labs normal - needs [place] in the AM - feels better prior to d/c. Pt agreeable.  Final Clinical Impressions(s) / ED Diagnoses   Final diagnoses:  Left  upper quadrant pain    New Prescriptions New Prescriptions   METFORMIN (GLUCOPHAGE) 500 MG TABLET    Take 1 tablet (500 mg total) by mouth 2 (two) times daily with a meal.     Eber Hong, MD 02/28/17 2308

## 2017-02-28 NOTE — Discharge Instructions (Signed)
You should come back in the morning for an ultrasound -  Tylenol only for pain Eat a healthy diet AFTER your ultrasound tomorrow.  ER for severe or worsening pain / vomiting

## 2017-02-28 NOTE — ED Triage Notes (Signed)
EMS called for pt having 4 days hx of LUQ abd pain  Pt is also Diabetic but is taking no metformin due to cannot afford   CBG was 302

## 2017-02-28 NOTE — ED Notes (Signed)
Education: Pt reports no PCP, and no money for metformin  [facility] as well as contacting pharm companies for help obtaining meds/asking for help [facility]  Pt reports being Dx'd with DM2 at age [age] - She has had no physician since her peds  She is educated regarding renal disease, neuropathy of vessels, gut, as well as extremities and the impact of DM on BP,  kidneys, and quality of life

## 2017-03-01 ENCOUNTER — Ambulatory Visit (HOSPITAL_COMMUNITY)
Admit: 2017-03-01 | Discharge: 2017-03-01 | Disposition: A | Discharge status: Home / Self Care | Payer: Medicaid Other | Attending: Emergency Medicine | Admitting: Emergency Medicine

## 2017-03-01 DIAGNOSIS — K760 Fatty (change of) liver, not elsewhere classified: Secondary | ICD-10-CM | POA: Insufficient documentation

## 2017-03-01 DIAGNOSIS — R1010 Upper abdominal pain, unspecified: Secondary | ICD-10-CM | POA: Insufficient documentation

## 2017-03-01 NOTE — ED Notes (Signed)
EDP reviewed US result. EDP reported to inform patient utrasound results are negative for cholecystitis but reported needs to follow-up with PCP or GI for further evaluation.  Pt verbalized understanding of results and need for follow-up.

## 2017-05-07 ENCOUNTER — Emergency Department (HOSPITAL_COMMUNITY)
Admission: EM | Admit: 2017-05-07 | Discharge: 2017-05-07 | Disposition: A | Discharge status: Home / Self Care | Payer: Self-pay | Attending: Emergency Medicine | Admitting: Emergency Medicine

## 2017-05-07 ENCOUNTER — Emergency Department (HOSPITAL_COMMUNITY): Payer: Self-pay

## 2017-05-07 ENCOUNTER — Encounter (HOSPITAL_COMMUNITY): Payer: Self-pay | Admitting: *Deleted

## 2017-05-07 DIAGNOSIS — Y9389 Activity, other specified: Secondary | ICD-10-CM | POA: Insufficient documentation

## 2017-05-07 DIAGNOSIS — Z7984 Long term (current) use of oral hypoglycemic drugs: Secondary | ICD-10-CM | POA: Insufficient documentation

## 2017-05-07 DIAGNOSIS — X509XXA Other and unspecified overexertion or strenuous movements or postures, initial encounter: Secondary | ICD-10-CM | POA: Insufficient documentation

## 2017-05-07 DIAGNOSIS — S93402A Sprain of unspecified ligament of left ankle, initial encounter: Secondary | ICD-10-CM | POA: Insufficient documentation

## 2017-05-07 DIAGNOSIS — Y929 Unspecified place or not applicable: Secondary | ICD-10-CM | POA: Insufficient documentation

## 2017-05-07 DIAGNOSIS — Y999 Unspecified external cause status: Secondary | ICD-10-CM | POA: Insufficient documentation

## 2017-05-07 DIAGNOSIS — E119 Type 2 diabetes mellitus without complications: Secondary | ICD-10-CM | POA: Insufficient documentation

## 2017-05-07 MED ORDER — IBUPROFEN 200 MG PO TABS
600.0000 mg | ORAL_TABLET | Freq: Once | ORAL | Status: AC
  Administered 2017-05-07: 04:00:00 600 mg via ORAL
  Filled 2017-05-07: qty 1

## 2017-05-07 MED ORDER — IBUPROFEN 600 MG PO TABS: 600.0000 mg | ORAL_TABLET | Freq: Four times a day (QID) | ORAL | 0 refills | Status: DC | PRN

## 2017-05-07 MED ORDER — HYDROCODONE-ACETAMINOPHEN 5-325 MG PO TABS: 1.0000 | ORAL_TABLET | Freq: Four times a day (QID) | ORAL | 0 refills | Status: DC | PRN

## 2017-05-07 NOTE — ED Notes (Signed)
Ice pack applied; ankle elevated

## 2017-05-07 NOTE — ED Notes (Signed)
Ortho paged for order

## 2017-05-07 NOTE — ED Notes (Signed)
Ortho remains at bedside

## 2017-05-07 NOTE — Progress Notes (Signed)
Orthopedic Tech Progress Note Patient Details:  Debra KayHeather L Miles 02/15/1996 [id number]  Ortho Devices Type of Ortho Device: ASO, Crutches Ortho Device/Splint Location: lle Ortho Device/Splint Interventions: Ordered, Application, Adjustment   Trinna PostMartinez, Dominique Ressel J 05/07/2017, 4:50 AM

## 2017-05-07 NOTE — ED Notes (Signed)
Advised pt. Was transported to xray from adult waiting area; called xray & they will bring her to peds ED room 6 once done

## 2017-05-07 NOTE — ED Notes (Signed)
Ortho tech at bedside 

## 2017-05-07 NOTE — ED Provider Notes (Signed)
[facility]-EMERGENCY DEPT Provider Note   CSN: [id number] Arrival date & time: 05/07/17  0213     History   Chief Complaint Chief Complaint  Patient presents with  . Ankle Pain    HPI Debra Miles is a 21 y.o. female.  Patient states she stepped wrong off a curb, felt and heard a pop in the lateral aspect of her left foot, and immediate swelling and pain unable to bear weight.      Past Medical History:  Diagnosis Date  . Diabetes (HCC)     There are no active problems to display for this patient.   Past Surgical History:  Procedure Laterality Date  . WISDOM TOOTH EXTRACTION      OB History    Gravida Para Term Preterm AB Living   0             SAB TAB Ectopic Multiple Live Births                   Home Medications    Prior to Admission medications   Medication Sig Start Date End Date Taking? Authorizing Provider  albuterol (PROVENTIL HFA;VENTOLIN HFA) 108 (90 Base) MCG/ACT inhaler Inhale 2 puffs into the lungs every 4 (four) hours as needed for wheezing or shortness of breath. Patient not taking: Reported on 02/28/2017 10/04/15   Everlene Farrieransie, William, PA-C  HYDROcodone-acetaminophen (NORCO/VICODIN) 5-325 MG tablet Take 1 tablet by mouth every 6 (six) hours as needed for severe pain. 05/07/17   Earley FavorSchulz, Marybeth Dandy, NP  ibuprofen (ADVIL,MOTRIN) 600 MG tablet Take 1 tablet (600 mg total) by mouth every 6 (six) hours as needed. 05/07/17   Earley FavorSchulz, Danniel Grenz, NP  metFORMIN (GLUCOPHAGE) 500 MG tablet Take 1 tablet (500 mg total) by mouth 2 (two) times daily with a meal. 02/28/17   Eber HongMiller, Brian, MD  Phenazopyridine HCl (AZO TABS PO) Take by mouth.    [provider]    Family History Family History  Problem Relation Age of Onset  . Hyperthyroidism Mother   . Hypertension Father   . Hyperthyroidism Brother   . Diabetes Paternal Aunt   . Cancer Maternal Grandmother   . Diabetes Paternal Grandmother   . Diabetes Paternal Grandfather   . Heart disease Paternal Grandfather   .  Kidney disease Paternal Grandfather   . Hypertension Paternal Grandfather     Social History Social History  Substance Use Topics  . Smoking status: Never Smoker  . Smokeless tobacco: Never Used  . Alcohol use Yes     Allergies   Patient has no known allergies.   Review of Systems Review of Systems  Constitutional: Negative for fever.  Musculoskeletal: Positive for joint swelling.  Skin: Positive for color change.  All other systems reviewed and are negative.    Physical Exam Updated Vital Signs BP (!) 151/109 (BP Location: Left Arm)   Pulse (!) 113   Temp 98.6 F (37 C) (Oral)   Resp 16   Ht 5\' 4"  (1.626 m)   Wt 110.2 kg (243 lb)   LMP 04/16/2017   SpO2 98%   BMI 41.71 kg/m   Physical Exam  Constitutional: She appears well-developed and well-nourished.  Eyes: Pupils are equal, round, and reactive to light.  Neck: Normal range of motion.  Cardiovascular: Normal rate.   Pulmonary/Chest: Effort normal.  Abdominal: Soft.  Musculoskeletal: Normal range of motion. She exhibits tenderness. She exhibits no deformity.       Feet:  Neurological: She is alert.  Skin:  Skin is warm. No erythema.  Psychiatric: She has a normal mood and affect.  Nursing note and vitals reviewed.    ED Treatments / Results  Labs (all labs ordered are listed, but only abnormal results are displayed) Labs Reviewed - No data to display  EKG  EKG Interpretation None       Radiology Dg Ankle Complete Left  Result Date: 05/07/2017 CLINICAL DATA:  Swelling after fall down stairs. EXAM: LEFT ANKLE COMPLETE - 3+ VIEW COMPARISON:  None. FINDINGS: Soft tissue swelling about the malleoli and dorsum of the midfoot. Well corticated rounded ossific densities are seen across the talonavicular joint likely related to old remote trauma. No acute displaced appearing fracture. Tiny calcaneal enthesophytes are present. No joint effusion. The subtalar joint is maintained. No widening of the medial  clear space of the ankle joint. IMPRESSION: 1. Soft tissue swelling without acute displaced fracture. 2. Mild degenerate change across the talonavicular joint. Electronically Signed   By: Tollie Eth M.D.   On: 05/07/2017 03:33    Procedures Procedures (including critical care time)  Medications Ordered in ED Medications  ibuprofen (ADVIL,MOTRIN) tablet 600 mg (600 mg Oral Given 05/07/17 0355)     Initial Impression / Assessment and Plan / ED Course  I have reviewed the triage vital signs and the nursing notes.  Pertinent labs & imaging results that were available during my care of the patient were reviewed by me and considered in my medical decision making (see chart for details).      Soft tissue swelling with no fracture.  Patient has been wasting Ace bandage with crutches.  She's been given an ASO that she can use in the next day or 2 when she has more mobility of her ankle.  She has an orthopedist that she will follow-up with for physical therapy and further assessment  Final Clinical Impressions(s) / ED Diagnoses   Final diagnoses:  Sprain of left ankle, unspecified ligament, initial encounter    New Prescriptions New Prescriptions   HYDROCODONE-ACETAMINOPHEN (NORCO/VICODIN) 5-325 MG TABLET    Take 1 tablet by mouth every 6 (six) hours as needed for severe pain.   IBUPROFEN (ADVIL,MOTRIN) 600 MG TABLET    Take 1 tablet (600 mg total) by mouth every 6 (six) hours as needed.     Earley Favor, NP 05/07/17 9604    Earley Favor, NP 05/07/17 0512    Ward, Layla Maw, DO 05/07/17 [phone]

## 2017-05-07 NOTE — Discharge Instructions (Signed)
Is gone.  Make an appointment with your orthopedist for follow-up evaluation and physical therapy, given, given a prescription for ibuprofen please take this on a regular basis.  He also been given a prescription for Vicodin take this for severe pain as needed, as well as crutches for assisted in ambulation.

## 2017-05-07 NOTE — ED Triage Notes (Signed)
Pt tripped on one step and fell injuring her L ankle. Reports hearing a pop and has had swelling since

## 2017-05-07 NOTE — ED Notes (Signed)
Pt. approx 5'4" or 5'5" per pt.; Advised ortho

## 2017-05-07 NOTE — ED Notes (Signed)
Pt. To bathroom in wheelchair with cousin & back to room to bed

## 2017-05-07 NOTE — ED Notes (Signed)
Pt. To room 6 from xray

## 2017-11-25 ENCOUNTER — Ambulatory Visit: Payer: Self-pay | Admitting: Obstetrics & Gynecology

## 2018-11-12 IMAGING — CR DG ANKLE COMPLETE 3+V*L*
3 series · 3 of 3 positions shown · non-contrast
Comparison: None.

CLINICAL DATA: Swelling after fall down stairs.

EXAM:
LEFT ANKLE COMPLETE - 3+ VIEW

[ankle ap]
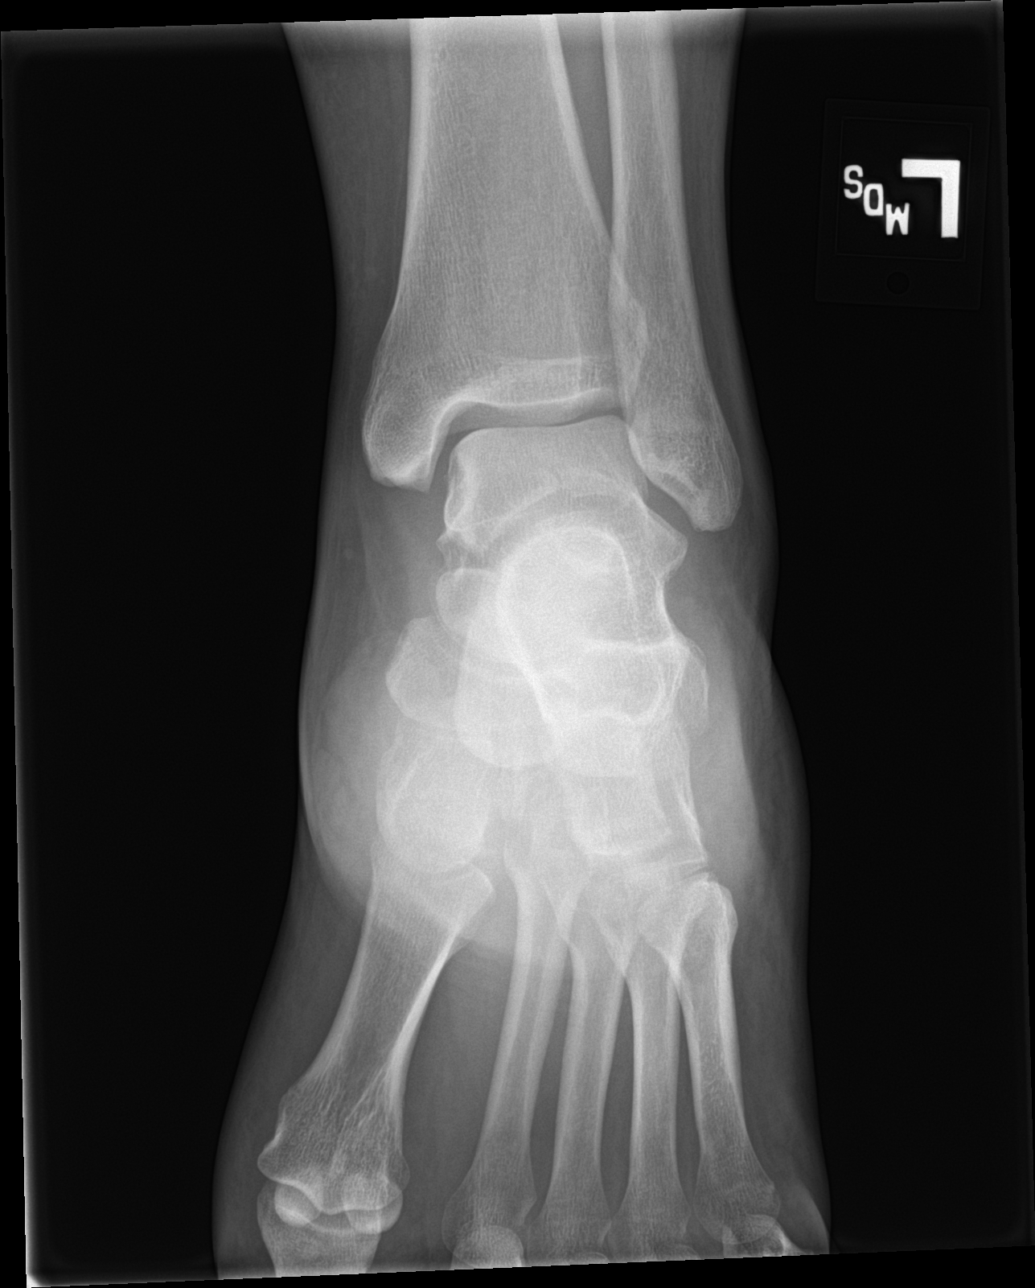

[ankle obl]
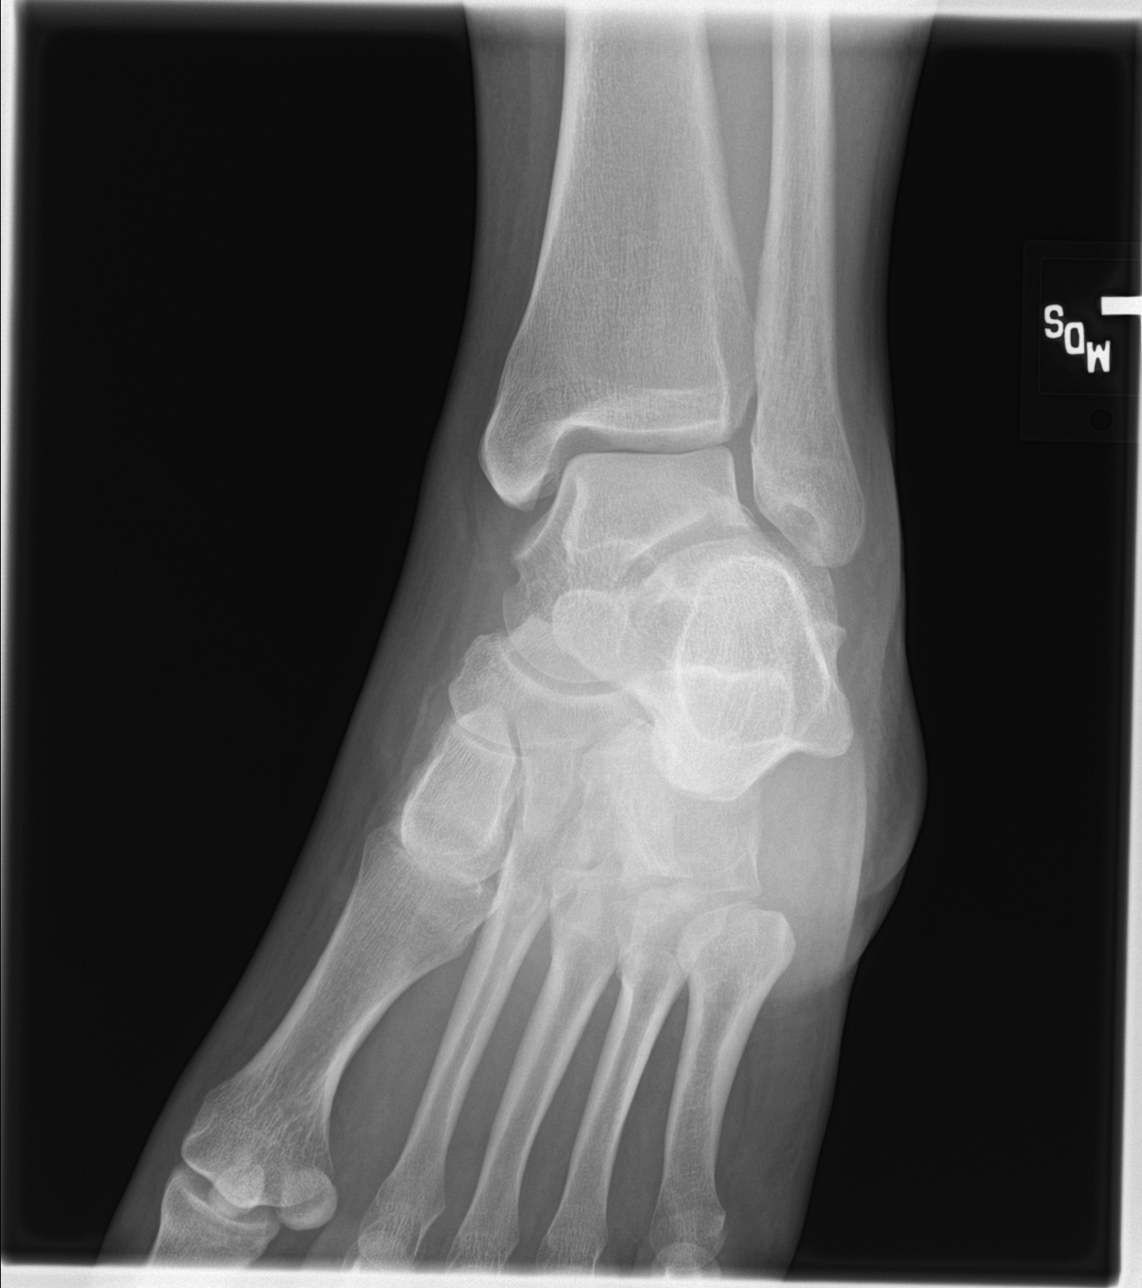

[ankle lat]
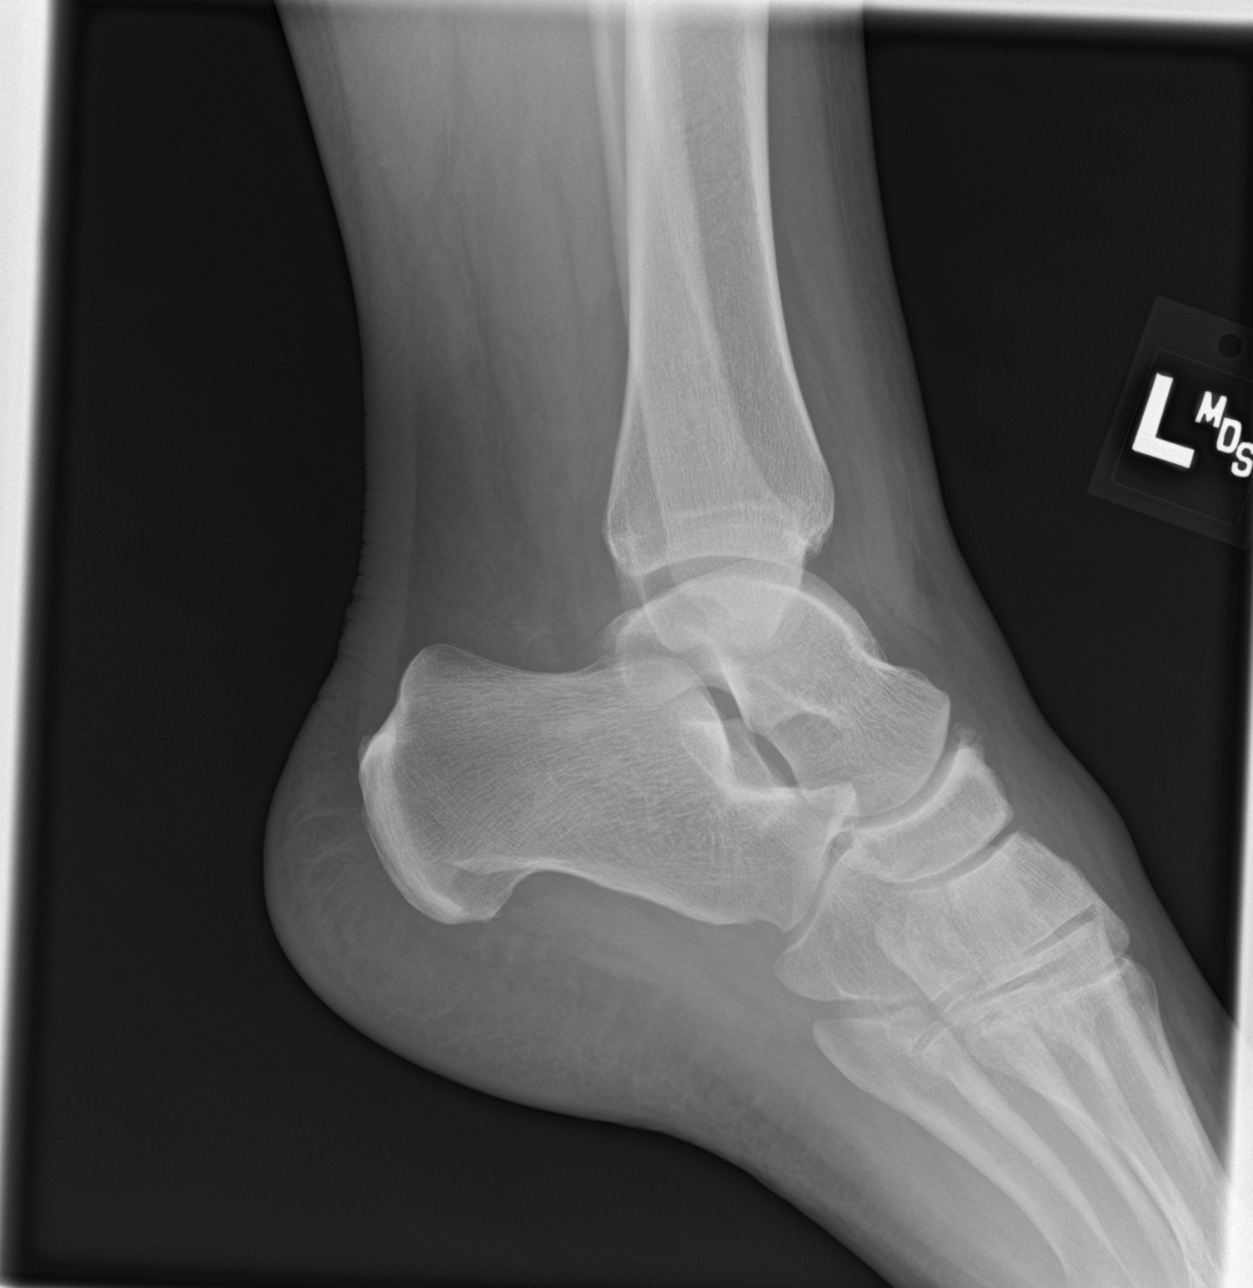

[3 of 3 positions shown; findings below may reference images not displayed]

FINDINGS: Soft tissue swelling about the malleoli and dorsum of the midfoot.
Well corticated rounded ossific densities are seen across the
talonavicular joint likely related to old remote trauma. No acute
displaced appearing fracture. Tiny calcaneal enthesophytes are
present. No joint effusion. The subtalar joint is maintained. No
widening of the medial clear space of the ankle joint.
IMPRESSION: 1. Soft tissue swelling without acute displaced fracture.
2. Mild degenerate change across the talonavicular joint.

## 2020-10-18 ENCOUNTER — Encounter: Payer: Medicaid Other | Admitting: Obstetrics & Gynecology

## 2022-06-18 ENCOUNTER — Telehealth: Payer: Self-pay | Admitting: Emergency Medicine

## 2022-06-18 ENCOUNTER — Ambulatory Visit
Admission: RE | Admit: 2022-06-18 | Discharge: 2022-06-18 | Disposition: A | Discharge status: Home / Self Care | Payer: Commercial Managed Care - HMO | Source: Ambulatory Visit | Attending: Emergency Medicine | Admitting: Emergency Medicine

## 2022-06-18 VITALS — BP 151/97 | HR 94 | Temp 98.7°F | Resp 16

## 2022-06-18 DIAGNOSIS — N3090 Cystitis, unspecified without hematuria: Secondary | ICD-10-CM | POA: Diagnosis not present

## 2022-06-18 LAB — POCT URINALYSIS DIP (MANUAL ENTRY)
Bilirubin, UA: NEGATIVE
Glucose, UA: >=1000 mg/dL — AB
Nitrite, UA: POSITIVE — AB
Protein Ur, POC: 100 mg/dL — AB
Spec Grav, UA: 1.015 (ref 1.010–1.025)
Urobilinogen, UA: 0.2 E.U./dL
pH, UA: 5.5 (ref 5.0–8.0)

## 2022-06-18 LAB — POCT URINE PREGNANCY: Preg Test, Ur: NEGATIVE

## 2022-06-18 MED ORDER — ALBUTEROL SULFATE HFA 108 (90 BASE) MCG/ACT IN AERS: 2.0000 | INHALATION_SPRAY | Freq: Four times a day (QID) | RESPIRATORY_TRACT | 0 refills | Status: DC | PRN

## 2022-06-18 MED ORDER — SULFAMETHOXAZOLE-TRIMETHOPRIM 800-160 MG PO TABS: 1.0000 | ORAL_TABLET | Freq: Two times a day (BID) | ORAL | 0 refills | Status: AC

## 2022-06-18 NOTE — Telephone Encounter (Signed)
Med refill requested for mild intermittent asthma.

## 2022-06-18 NOTE — Discharge Instructions (Addendum)
Common causes of urinary tract infections include but are not limited to holding your urine longer than you should, squatting instead of sitting down when urinating, sitting around in wet clothing such as a wet swimsuit or gym clothes too long, not emptying your bladder after having sexual intercourse, wiping from back to front instead of front to back after having a bowel movement.  Less common causes of urinary tract infections include but are not limited to anatomical shifts in the location of your bladder or uterus causing obstruction of passage of urine from your bladder to your urethra where your urine comes out or prolapse of your rectum into your vaginal wall.  These less common causes can be evaluated by gynecologist, a urologist or subspecialist called a uro-gynecologist   The urinalysis that we performed in the clinic today was abnormal.  You were advised to begin antibiotics today because your urinalysis is abnormal and you are having active symptoms of an acute lower urinary tract infection also known as cystitis.  It is very important that you take all doses exactly as prescribed.  Incomplete antibiotic therapy can cause worsening urinary tract infection that can become aggressive, escape from urinary tract into your bloodstream causing sepsis which will require hospitalization.   Please pick up and begin taking your prescription for Bactrim DS (trimethoprim sulfamethoxazole) as soon as possible.  Please take all doses exactly as prescribed.  You can take this medication with or without food.  This medication is safe to take with your other medications.   If you have not had complete resolution of your symptoms after completing treatment as prescribed, please return to urgent care for repeat evaluation or follow-up with your primary care provider.   Thank you for visiting urgent care today.  I appreciate the opportunity to participate in your care.

## 2022-06-18 NOTE — ED Triage Notes (Signed)
Pt c/o urinary frequency and tried taking cranberry pills.  Also for past few days having body aches, cough congestion.

## 2022-06-18 NOTE — ED Provider Notes (Signed)
[facility]    CSN: [id number] Arrival date & time: 06/18/22  2993    HISTORY   Chief Complaint  Patient presents with   Urinary Frequency    Fever , chills . - Entered by patient   HPI Debra Miles is a pleasant, 26 y.o. female who presents to urgent care today. Patient complains of increased frequency of urination.  Patient states she is been taking cranberry pills for relief of her symptoms.  Patient has significantly elevated pressure pressure on arrival today with otherwise normal vital signs.  Patient endorses increased frequency of urination, increased urge to urinate, and sensation of incomplete emptying.  Patient denies abnormal odor of urine, burning with urination, suprapubic pain, perineal pain, left-sided flank pain, right-sided flank pain, fever, chills, malaise, rigors, significant fatigue, abnormal vaginal discharge, vaginal itching, and vaginal irritation.     The history is provided by the patient.   Past Medical History:  Diagnosis Date   Diabetes (HCC)    There are no problems to display for this patient.  Past Surgical History:  Procedure Laterality Date   WISDOM TOOTH EXTRACTION     OB History     Gravida  0   Para      Term      Preterm      AB      Living         SAB      IAB      Ectopic      Multiple      Live Births             Home Medications    Prior to Admission medications   Medication Sig Start Date End Date Taking? Authorizing Provider  albuterol (PROVENTIL HFA;VENTOLIN HFA) 108 (90 Base) MCG/ACT inhaler Inhale 2 puffs into the lungs every 4 (four) hours as needed for wheezing or shortness of breath. Patient not taking: Reported on 02/28/2017 10/04/15   Everlene Farrier, PA-C  HYDROcodone-acetaminophen (NORCO/VICODIN) 5-325 MG tablet Take 1 tablet by mouth every 6 (six) hours as needed for severe pain. 05/07/17   Earley Favor, NP  ibuprofen (ADVIL,MOTRIN) 600 MG tablet Take 1 tablet (600 mg total) by mouth  every 6 (six) hours as needed. 05/07/17   Earley Favor, NP  metFORMIN (GLUCOPHAGE) 500 MG tablet Take 1 tablet (500 mg total) by mouth 2 (two) times daily with a meal. 02/28/17   Eber Hong, MD  Phenazopyridine HCl (AZO TABS PO) Take by mouth.    [provider]    Family History Family History  Problem Relation Age of Onset   Hyperthyroidism Mother    Hypertension Father    Hyperthyroidism Brother    Diabetes Paternal Aunt    Cancer Maternal Grandmother    Diabetes Paternal Grandmother    Diabetes Paternal Grandfather    Heart disease Paternal Grandfather    Kidney disease Paternal Grandfather    Hypertension Paternal Grandfather    Social History Social History   Tobacco Use   Smoking status: Never   Smokeless tobacco: Never  Substance Use Topics   Alcohol use: Yes   Drug use: No   Allergies   Patient has no known allergies.  Review of Systems Review of Systems Pertinent findings revealed after performing a 14 point review of systems has been noted in the history of present illness.  Physical Exam Triage Vital Signs ED Triage Vitals  Enc Vitals Group     BP 07/27/21 0827 (!) 147/82  Pulse Rate 07/27/21 0827 72     Resp 07/27/21 0827 18     Temp 07/27/21 0827 98.3 F (36.8 C)     Temp Source 07/27/21 0827 Oral     SpO2 07/27/21 0827 98 %     Weight --      Height --      Head Circumference --      Peak Flow --      Pain Score 07/27/21 0826 5     Pain Loc --      Pain Edu? --      Excl. in [place]? --   No data found.  Updated Vital Signs BP (!) 151/97 (BP Location: Left Arm)   Pulse 94   Temp 98.7 F (37.1 C) (Oral)   Resp 16   SpO2 97%   Physical Exam Vitals and nursing note reviewed.  Constitutional:      General: She is not in acute distress.    Appearance: Normal appearance. She is not ill-appearing.  HENT:     Head: Normocephalic and atraumatic.  Eyes:     General: Lids are normal.        Right eye: No discharge.        Left eye:  No discharge.     Extraocular Movements: Extraocular movements intact.     Conjunctiva/sclera: Conjunctivae normal.     Right eye: Right conjunctiva is not injected.     Left eye: Left conjunctiva is not injected.  Neck:     Trachea: Trachea and phonation normal.  Cardiovascular:     Rate and Rhythm: Normal rate and regular rhythm.     Pulses: Normal pulses.     Heart sounds: Normal heart sounds. No murmur heard.    No friction rub. No gallop.  Pulmonary:     Effort: Pulmonary effort is normal. No accessory muscle usage, prolonged expiration or respiratory distress.     Breath sounds: Normal breath sounds. No stridor, decreased air movement or transmitted upper airway sounds. No decreased breath sounds, wheezing, rhonchi or rales.  Chest:     Chest wall: No tenderness.  Abdominal:     General: Abdomen is flat. Bowel sounds are normal. There is no distension.     Palpations: Abdomen is soft.     Tenderness: There is abdominal tenderness in the suprapubic area. There is no right CVA tenderness or left CVA tenderness.     Hernia: No hernia is present.  Musculoskeletal:        General: Normal range of motion.     Cervical back: Normal range of motion and neck supple. Normal range of motion.  Lymphadenopathy:     Cervical: No cervical adenopathy.  Skin:    General: Skin is warm and dry.     Findings: No erythema or rash.  Neurological:     General: No focal deficit present.     Mental Status: She is alert and oriented to person, place, and time.  Psychiatric:        Mood and Affect: Mood normal.        Behavior: Behavior normal.     Visual Acuity Right Eye Distance:   Left Eye Distance:   Bilateral Distance:    Right Eye Near:   Left Eye Near:    Bilateral Near:     UC Couse / Diagnostics / Procedures:     Radiology No results found.  Procedures Procedures (including critical care time) EKG  Pending results:  Labs Reviewed  POCT URINALYSIS DIP (MANUAL ENTRY)   POCT URINE PREGNANCY    Medications Ordered in UC: Medications - No data to display  UC Diagnoses / Final Clinical Impressions(s)   I have reviewed the triage vital signs and the nursing notes.  Pertinent labs & imaging results that were available during my care of the patient were reviewed by me and considered in my medical decision making (see chart for details).    Final diagnoses:  None    {LMSTDP:27058}  {LMUTIP:27060}  ED Prescriptions   None    PDMP not reviewed this encounter.  Disposition Upon Discharge:  Condition: stable for discharge home  Patient presented with concern for an acute illness with associated systemic symptoms and significant discomfort requiring urgent management. In my opinion, this is a condition that a prudent lay person (someone who possesses an average knowledge of health and medicine) may potentially expect to result in complications if not addressed urgently such as respiratory distress, impairment of bodily function or dysfunction of bodily organs.   As such, the patient has been evaluated and assessed, work-up was performed and treatment was provided in alignment with urgent care protocols and evidence based medicine.  Patient/parent/caregiver has been advised that the patient may require follow up for further testing and/or treatment if the symptoms continue in spite of treatment, as clinically indicated and appropriate.  Routine symptom specific, illness specific and/or disease specific instructions were discussed with the patient and/or caregiver at length.  Prevention strategies for avoiding STD exposure were also discussed.  The patient will follow up with their current PCP if and as advised. If the patient does not currently have a PCP we will assist them in obtaining one.   The patient may need specialty follow up if the symptoms continue, in spite of conservative treatment and management, for further workup, evaluation, consultation  and treatment as clinically indicated and appropriate.  Patient/parent/caregiver verbalized understanding and agreement of plan as discussed.  All questions were addressed during visit.  Please see discharge instructions below for further details of plan.  Discharge Instructions: Discharge Instructions   None     This office note has been dictated using Dragon speech recognition software.  Unfortunately, this method of dictation can sometimes lead to typographical or grammatical errors.  I apologize for your inconvenience in advance if this occurs.  Please do not hesitate to reach out to me if clarification is needed.

## 2022-07-30 ENCOUNTER — Ambulatory Visit (INDEPENDENT_AMBULATORY_CARE_PROVIDER_SITE_OTHER): Payer: Commercial Managed Care - HMO

## 2022-07-30 ENCOUNTER — Ambulatory Visit
Admission: RE | Admit: 2022-07-30 | Discharge: 2022-07-30 | Disposition: A | Discharge status: Home / Self Care | Payer: Commercial Managed Care - HMO | Source: Ambulatory Visit | Attending: Emergency Medicine | Admitting: Emergency Medicine

## 2022-07-30 VITALS — BP 163/104 | HR 76 | Temp 98.1°F | Resp 16

## 2022-07-30 DIAGNOSIS — M79672 Pain in left foot: Secondary | ICD-10-CM

## 2022-07-30 DIAGNOSIS — R61 Generalized hyperhidrosis: Secondary | ICD-10-CM

## 2022-07-30 DIAGNOSIS — S93402A Sprain of unspecified ligament of left ankle, initial encounter: Secondary | ICD-10-CM

## 2022-07-30 DIAGNOSIS — R81 Glycosuria: Secondary | ICD-10-CM

## 2022-07-30 DIAGNOSIS — M25572 Pain in left ankle and joints of left foot: Secondary | ICD-10-CM

## 2022-07-30 DIAGNOSIS — N760 Acute vaginitis: Secondary | ICD-10-CM | POA: Diagnosis not present

## 2022-07-30 LAB — POCT URINALYSIS DIP (MANUAL ENTRY)
Bilirubin, UA: NEGATIVE
Glucose, UA: >=1000 mg/dL — AB
Ketones, POC UA: NEGATIVE mg/dL
Leukocytes, UA: NEGATIVE
Nitrite, UA: NEGATIVE
Protein Ur, POC: NEGATIVE mg/dL
Spec Grav, UA: 1.015 (ref 1.010–1.025)
Urobilinogen, UA: 0.2 E.U./dL
pH, UA: 6.5 (ref 5.0–8.0)

## 2022-07-30 LAB — POCT URINE PREGNANCY: Preg Test, Ur: NEGATIVE

## 2022-07-30 NOTE — ED Triage Notes (Signed)
Pt c/o having a fall on Friday, she states her right knee and left foot are swollen and there is pain to the sites.   The patient c/o vaginal odor and itching that began 4-5 day ago.

## 2022-07-30 NOTE — ED Provider Notes (Signed)
[facility]    CSN: [id number] Arrival date & time: 07/30/22  0850    HISTORY   Chief Complaint  Patient presents with   SEXUALLY TRANSMITTED DISEASE   Fall   Leg Injury   Leg Pain   Knee Pain   Knee Injury   HPI Debra Miles is a pleasant, 26 y.o. female who presents to urgent care today. Patient reports having fallen 5 days ago, skinned  her right knee and twisted her left foot and ankle when she had a misstep off of an uneven concrete curb.  Patient denies prior history of traumatic injury to her left ankle.  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  Patient reports pain with weightbearing, denies loss of range of motion.  Patient also complains of vaginal itching with unusual odor for the past 4 to 5 days.Patient denies abnormal odor of urine, burning with urination, increased frequency of urination, increased urge to urinate, suprapubic pain, perineal pain, fever, chills, malaise, rigors, significant fatigue, abnormal vaginal discharge, vaginal irritation, dyspareunia, genital lesion(s), known exposure to STD, and possible exposure to STD.  '  The history is provided by the patient.   Past Medical History:  Diagnosis Date   Diabetes (HCC)    There are no problems to display for this patient.  Past Surgical History:  Procedure Laterality Date   WISDOM TOOTH EXTRACTION     OB History     Gravida  0   Para      Term      Preterm      AB      Living         SAB      IAB      Ectopic      Multiple      Live Births             Home Medications    Prior to Admission medications   Medication Sig Start Date End Date Taking? Authorizing Provider  albuterol (VENTOLIN HFA) 108 (90 Base) MCG/ACT inhaler Inhale 2 puffs into the lungs every 6 (six) hours as needed for wheezing or shortness of breath (Cough).  06/18/22   Theadora RamaMorgan, Tristan Bramble Scales, PA-C  ibuprofen (ADVIL,MOTRIN) 600 MG tablet Take 1 tablet (600 mg total) by mouth every 6 (six) hours as needed. 05/07/17   Earley FavorSchulz, Gail, NP    Family History Family History  Problem Relation Age of Onset   Hyperthyroidism Mother    Hypertension Father    Hyperthyroidism Brother    Diabetes Paternal Aunt    Cancer Maternal Grandmother    Diabetes Paternal Grandmother    Diabetes Paternal Grandfather    Heart disease Paternal Grandfather    Kidney disease Paternal Grandfather    Hypertension Paternal Grandfather    Social History Social History   Tobacco Use   Smoking status: Never   Smokeless tobacco: Never  Substance Use Topics   Alcohol use: Yes   Drug use: No   Allergies   Patient has no known allergies.  Review of Systems Review of Systems Pertinent findings revealed after performing a 14 point review of systems has been noted in the history of present illness.  Physical Exam Triage Vital Signs ED Triage Vitals  Enc Vitals Group     BP 07/27/21 0827 (!) 147/82     Pulse Rate  07/27/21 0827 72     Resp 07/27/21 0827 18     Temp 07/27/21 0827 98.3 F (36.8 C)     Temp Source 07/27/21 0827 Oral     SpO2 07/27/21 0827 98 %     Weight --      Height --      Head Circumference --      Peak Flow --      Pain Score 07/27/21 0826 5     Pain Loc --      Pain Edu? --      Excl. in [place]? --   No data found.  Updated Vital Signs BP (!) 163/104 (BP Location: Right Arm)   Pulse 76   Temp 98.1 F (36.7 C) (Oral)   Resp 16   LMP 07/18/2022   SpO2 97%   Physical Exam Vitals and nursing note reviewed.  Constitutional:      General: She is not in acute distress.    Appearance: Normal appearance. She is not ill-appearing.  HENT:     Head: Normocephalic and atraumatic.  Eyes:     General: Lids are normal.        Right eye: No discharge.        Left eye: No discharge.     Extraocular Movements: Extraocular movements intact.      Conjunctiva/sclera: Conjunctivae normal.     Right eye: Right conjunctiva is not injected.     Left eye: Left conjunctiva is not injected.  Neck:     Trachea: Trachea and phonation normal.  Cardiovascular:     Rate and Rhythm: Normal rate and regular rhythm.     Pulses: Normal pulses.     Heart sounds: Normal heart sounds. No murmur heard.    No friction rub. No gallop.  Pulmonary:     Effort: Pulmonary effort is normal. No accessory muscle usage, prolonged expiration or respiratory distress.     Breath sounds: Normal breath sounds. No stridor, decreased air movement or transmitted upper airway sounds. No decreased breath sounds, wheezing, rhonchi or rales.  Chest:     Chest wall: No tenderness.  Genitourinary:    Comments: Patient politely declines pelvic exam today, patient provided a vaginal swab for testing. Musculoskeletal:        General: Normal range of motion.     Cervical back: Normal range of motion and neck supple. Normal range of motion.     Right ankle: Normal.     Right Achilles Tendon: Normal.     Left ankle: Swelling present. No deformity, ecchymosis or lacerations. Tenderness present over the ATF ligament and AITF ligament. Normal range of motion. Anterior drawer test negative. Normal pulse.     Left Achilles Tendon: Normal.     Right foot: Normal.     Left foot: Normal.  Lymphadenopathy:     Cervical: No cervical adenopathy.  Skin:    General: Skin is warm and dry.     Findings: No erythema or rash.  Neurological:     General: No focal deficit present.     Mental Status: She is alert and oriented to person, place, and time.  Psychiatric:        Mood and Affect: Mood normal.        Behavior: Behavior normal.     Visual Acuity Right Eye Distance:   Left Eye Distance:   Bilateral Distance:    Right Eye Near:   Left Eye Near:    Bilateral Near:  UC Couse / Diagnostics / Procedures:     Radiology DG Ankle Complete Left  Result Date:  07/30/2022 CLINICAL DATA:  Fall off uneven concrete curb 4 days prior. Twisted ankle/foot. Swelling and pain. EXAM: LEFT ANKLE COMPLETE - 3+ VIEW; LEFT FOOT - COMPLETE 3+ VIEW COMPARISON:  Left ankle radiographs 05/07/2017 FINDINGS: Left ankle: Small plantar calcaneal heel spur is mostly new from prior. Minimal chronic enthesopathic change at the Achilles insertion on the calcaneus. No significant change in mild chronic osteophytes/ossicles at the posterior dorsal aspect of the navicular at the talonavicular joint. Mild soft tissue swelling dorsal to the talonavicular and navicular-cuneiform is similar to prior. Left foot: Mild peripheral medial spurring at the great toe tarsometatarsal joint. No acute fracture or dislocation. IMPRESSION: 1. Mild soft tissue swelling dorsal to the talonavicular and navicular-cuneiform is similar to prior. 2. Small plantar calcaneal heel spur. 3. No acute fracture is seen. Electronically Signed   By: Yvonne Kendall M.D.   On: 07/30/2022 10:53   DG Foot Complete Left  Result Date: 07/30/2022 CLINICAL DATA:  Fall off uneven concrete curb 4 days prior. Twisted ankle/foot. Swelling and pain. EXAM: LEFT ANKLE COMPLETE - 3+ VIEW; LEFT FOOT - COMPLETE 3+ VIEW COMPARISON:  Left ankle radiographs 05/07/2017 FINDINGS: Left ankle: Small plantar calcaneal heel spur is mostly new from prior. Minimal chronic enthesopathic change at the Achilles insertion on the calcaneus. No significant change in mild chronic osteophytes/ossicles at the posterior dorsal aspect of the navicular at the talonavicular joint. Mild soft tissue swelling dorsal to the talonavicular and navicular-cuneiform is similar to prior. Left foot: Mild peripheral medial spurring at the great toe tarsometatarsal joint. No acute fracture or dislocation. IMPRESSION: 1. Mild soft tissue swelling dorsal to the talonavicular and navicular-cuneiform is similar to prior. 2. Small plantar calcaneal heel spur. 3. No acute fracture is  seen. Electronically Signed   By: Yvonne Kendall M.D.   On: 07/30/2022 10:53    Procedures Procedures (including critical care time) EKG  Pending results:  Labs Reviewed  POCT URINALYSIS DIP (MANUAL ENTRY) - Abnormal; Notable for the following components:      Result Value   Glucose, UA >=1,000 (*)    Blood, UA trace-lysed (*)    All other components within normal limits  POCT URINE PREGNANCY  CERVICOVAGINAL ANCILLARY ONLY    Medications Ordered in UC: Medications - No data to display  UC Diagnoses / Final Clinical Impressions(s)   I have reviewed the triage vital signs and the nursing notes.  Pertinent labs & imaging results that were available during my care of the patient were reviewed by me and considered in my medical decision making (see chart for details).    Final diagnoses:  Sprain of left ankle, unspecified ligament, initial encounter  Acute vaginitis  Glucose found in urine on examination  Patient advised of x-ray findings.  Recommend patient remain nonweightbearing for the next 10 to 14 days.   Patient provided with crutches for use while ambulating for the next 10 to 14 days. Patient provided with a stabilizing ankle brace to wear after that.  Recommend follow-up with orthopedics if no improvement after 7 to 10 days.  STD screening was performed, patient advised that the results be posted to their MyChart and if any of the results are positive, they will be notified by phone, further treatment will be provided as indicated based on results of STD screening. Patient was advised to abstain from sexual intercourse until that they receive the  results of their STD testing.  Patient was also advised to use condoms to protect themselves from STD exposure. Urinalysis today revealed a significant amount of glucose, patient was advised to follow-up with primary care provider for further evaluation for possible type 2 diabetes. Urine pregnancy test was negative. Return precautions  advised.  Drug allergies reviewed, all questions addressed.     ED Prescriptions   None    PDMP not reviewed this encounter.  Disposition Upon Discharge:  Condition: stable for discharge home  Patient presented with concern for an acute illness with associated systemic symptoms and significant discomfort requiring urgent management. In my opinion, this is a condition that a prudent lay person (someone who possesses an average knowledge of health and medicine) may potentially expect to result in complications if not addressed urgently such as respiratory distress, impairment of bodily function or dysfunction of bodily organs.   As such, the patient has been evaluated and assessed, work-up was performed and treatment was provided in alignment with urgent care protocols and evidence based medicine.  Patient/parent/caregiver has been advised that the patient may require follow up for further testing and/or treatment if the symptoms continue in spite of treatment, as clinically indicated and appropriate.  Routine symptom specific, illness specific and/or disease specific instructions were discussed with the patient and/or caregiver at length.  Prevention strategies for avoiding STD exposure were also discussed.  The patient will follow up with their current PCP if and as advised. If the patient does not currently have a PCP we will assist them in obtaining one.   The patient may need specialty follow up if the symptoms continue, in spite of conservative treatment and management, for further workup, evaluation, consultation and treatment as clinically indicated and appropriate.  Patient/parent/caregiver verbalized understanding and agreement of plan as discussed.  All questions were addressed during visit.  Please see discharge instructions below for further details of plan.  Discharge Instructions:   Discharge Instructions      The imaging of your left foot and ankle did not reveal any  acute bony fractures.  It did reveal possible ankle sprain.  We have provided you with crutches so that you can avoid bearing weight on it for the next 2 weeks.  We have also provided you with a stabilizing ankle brace that I would like for you to wear when you begin attempting to bear weight on your left ankle after 2 weeks of complete rest.  If you do not have any meaningful improvement of your pain after 2 weeks, please consider following up with [facility] at the urgent care clinic or make an appointment with an orthopedic provider of your choice.  The results of your vaginal swab test which screens for BV, yeast, gonorrhea, chlamydia and trichomonas will be made posted to your MyChart account once it is complete.  This typically takes 2 to 4 days.  Please abstain from sexual intercourse of any kind, vaginal, oral or anal, until you have received the results of your STD testing.     If any of your results are abnormal, you will receive a phone call regarding treatment.  Prescriptions, if any are needed, will be provided for you at your pharmacy.     Your urine pregnancy test today is negative.   Our point-of-care analysis of your urine sample today was normal and did not reveal any concern for urinary tract infection but there was a significant amount of sugar in your urine which is an abnormal  finding.  We have help to find a primary care provider today.  Please follow-up with them for evaluation of type 2 diabetes as soon as possible.   If you have not had complete resolution of your vaginal odor and vaginal itching after completing any needed treatment, please return for repeat evaluation.   Thank you for visiting urgent care today.  I appreciate the opportunity to participate in your care.       This office note has been dictated using Teaching laboratory technician.  Unfortunately, this method of dictation can sometimes lead to typographical or grammatical errors.  I  apologize for your inconvenience in advance if this occurs.  Please do not hesitate to reach out to me if clarification is needed.       Niema, Carrara Scales, PA-C 07/31/22 1706

## 2022-07-30 NOTE — Discharge Instructions (Addendum)
The imaging of your left foot and ankle did not reveal any acute bony fractures.  It did reveal possible ankle sprain.  We have provided you with crutches so that you can avoid bearing weight on it for the next 2 weeks.  We have also provided you with a stabilizing ankle brace that I would like for you to wear when you begin attempting to bear weight on your left ankle after 2 weeks of complete rest.  If you do not have any meaningful improvement of your pain after 2 weeks, please consider following up with [facility] at the urgent care clinic or make an appointment with an orthopedic provider of your choice.  The results of your vaginal swab test which screens for BV, yeast, gonorrhea, chlamydia and trichomonas will be made posted to your MyChart account once it is complete.  This typically takes 2 to 4 days.  Please abstain from sexual intercourse of any kind, vaginal, oral or anal, until you have received the results of your STD testing.     If any of your results are abnormal, you will receive a phone call regarding treatment.  Prescriptions, if any are needed, will be provided for you at your pharmacy.     Your urine pregnancy test today is negative.   Our point-of-care analysis of your urine sample today was normal and did not reveal any concern for urinary tract infection but there was a significant amount of sugar in your urine which is an abnormal finding.  We have help to find a primary care provider today.  Please follow-up with them for evaluation of type 2 diabetes as soon as possible.   If you have not had complete resolution of your vaginal odor and vaginal itching after completing any needed treatment, please return for repeat evaluation.   Thank you for visiting urgent care today.  I appreciate the opportunity to participate in your care.

## 2022-07-31 LAB — CERVICOVAGINAL ANCILLARY ONLY
Bacterial Vaginitis (gardnerella): POSITIVE — AB
Candida Glabrata: POSITIVE — AB
Candida Vaginitis: POSITIVE — AB
Chlamydia: NEGATIVE
Comment: NEGATIVE
Comment: NEGATIVE
Comment: NEGATIVE
Comment: NEGATIVE
Comment: NEGATIVE
Comment: NORMAL
Neisseria Gonorrhea: NEGATIVE
Trichomonas: NEGATIVE

## 2022-08-01 ENCOUNTER — Telehealth (HOSPITAL_COMMUNITY): Payer: Self-pay | Admitting: Emergency Medicine

## 2022-08-01 MED ORDER — FLUCONAZOLE 150 MG PO TABS: 150.0000 mg | ORAL_TABLET | Freq: Once | ORAL | 0 refills | Status: AC

## 2022-08-01 MED ORDER — METRONIDAZOLE 500 MG PO TABS: 500.0000 mg | ORAL_TABLET | Freq: Two times a day (BID) | ORAL | 0 refills | Status: DC

## 2022-09-03 ENCOUNTER — Ambulatory Visit (INDEPENDENT_AMBULATORY_CARE_PROVIDER_SITE_OTHER): Payer: Commercial Managed Care - HMO | Admitting: Physician Assistant

## 2022-09-03 ENCOUNTER — Encounter: Payer: Self-pay | Admitting: Physician Assistant

## 2022-09-03 VITALS — BP 160/100 | HR 94 | Temp 98.0°F | Ht 64.0 in | Wt 233.2 lb

## 2022-09-03 DIAGNOSIS — Z13220 Encounter for screening for lipoid disorders: Secondary | ICD-10-CM

## 2022-09-03 DIAGNOSIS — E282 Polycystic ovarian syndrome: Secondary | ICD-10-CM | POA: Diagnosis not present

## 2022-09-03 DIAGNOSIS — E1165 Type 2 diabetes mellitus with hyperglycemia: Secondary | ICD-10-CM | POA: Diagnosis not present

## 2022-09-03 DIAGNOSIS — B009 Herpesviral infection, unspecified: Secondary | ICD-10-CM | POA: Diagnosis not present

## 2022-09-03 DIAGNOSIS — I10 Essential (primary) hypertension: Secondary | ICD-10-CM | POA: Diagnosis not present

## 2022-09-03 LAB — COMPREHENSIVE METABOLIC PANEL
ALT: 19 U/L (ref 0–35)
AST: 12 U/L (ref 0–37)
Albumin: 4.1 g/dL (ref 3.5–5.2)
Alkaline Phosphatase: 77 U/L (ref 39–117)
BUN: 9 mg/dL (ref 6–23)
CO2: 28 mEq/L (ref 19–32)
Calcium: 8.8 mg/dL (ref 8.4–10.5)
Chloride: 99 mEq/L (ref 96–112)
Creatinine, Ser: 0.59 mg/dL (ref 0.40–1.20)
GFR: 123.41 mL/min (ref >60.00)
Glucose, Bld: 226 mg/dL — ABNORMAL HIGH (ref 70–99)
Potassium: 4 mEq/L (ref 3.5–5.1)
Sodium: 135 mEq/L (ref 135–145)
Total Bilirubin: 0.4 mg/dL (ref 0.2–1.2)
Total Protein: 7 g/dL (ref 6.0–8.3)

## 2022-09-03 LAB — CBC WITH DIFFERENTIAL/PLATELET
Basophils Absolute: 0 10*3/uL (ref 0.0–0.1)
Basophils Relative: 0.5 % (ref 0.0–3.0)
Eosinophils Absolute: 0.2 10*3/uL (ref 0.0–0.7)
Eosinophils Relative: 2.2 % (ref 0.0–5.0)
HCT: 44.4 % (ref 36.0–46.0)
Hemoglobin: 15.7 g/dL — ABNORMAL HIGH (ref 12.0–15.0)
Lymphocytes Relative: 27.2 % (ref 12.0–46.0)
Lymphs Abs: 2.6 10*3/uL (ref 0.7–4.0)
MCHC: 35.4 g/dL (ref 30.0–36.0)
MCV: 84.7 fl (ref 78.0–100.0)
Monocytes Absolute: 0.5 10*3/uL (ref 0.1–1.0)
Monocytes Relative: 5.5 % (ref 3.0–12.0)
Neutro Abs: 6.2 10*3/uL (ref 1.4–7.7)
Neutrophils Relative %: 64.6 % (ref 43.0–77.0)
Platelets: 414 10*3/uL — ABNORMAL HIGH (ref 150.0–400.0)
RBC: 5.25 Mil/uL — ABNORMAL HIGH (ref 3.87–5.11)
RDW: 13 % (ref 11.5–15.5)
WBC: 9.5 10*3/uL (ref 4.0–10.5)

## 2022-09-03 LAB — MICROALBUMIN / CREATININE URINE RATIO
Creatinine,U: 96.6 mg/dL
Microalb Creat Ratio: 5.2 mg/g (ref 0.0–30.0)
Microalb, Ur: 5 mg/dL — ABNORMAL HIGH (ref 0.0–1.9)

## 2022-09-03 LAB — LDL CHOLESTEROL, DIRECT: Direct LDL: 99 mg/dL

## 2022-09-03 LAB — LIPID PANEL
Cholesterol: 157 mg/dL (ref 0–200)
HDL: 43.4 mg/dL (ref >39.00)
NonHDL: 113.57
Total CHOL/HDL Ratio: 4
Triglycerides: 206 mg/dL — ABNORMAL HIGH (ref 0.0–149.0)
VLDL: 41.2 mg/dL — ABNORMAL HIGH (ref 0.0–40.0)

## 2022-09-03 LAB — TSH: TSH: 1.59 u[IU]/mL (ref 0.35–5.50)

## 2022-09-03 LAB — HEMOGLOBIN A1C: Hgb A1c MFr Bld: 9.9 % — ABNORMAL HIGH (ref 4.6–6.5)

## 2022-09-03 MED ORDER — VALACYCLOVIR HCL 500 MG PO TABS: 500.0000 mg | ORAL_TABLET | Freq: Every day | ORAL | 1 refills | Status: DC

## 2022-09-03 NOTE — Progress Notes (Signed)
Subjective:    Patient ID: Debra Miles, female    DOB: [birth year], 26 y.o.   MRN: [id number]  Chief Complaint  Patient presents with   New Patient (Initial Visit)    NP in office to establish care with PCP; discuss diabetes management; pt not seen a provider in 10 yrs;     HPI 26 y.o. patient presents today for new patient establishment with me.  Patient was previously established with pediatrician. Works as a Production assistant, radio at [employer].   Current Care Team: No current specialists, last pap at health dept    Acute Concerns: Main concern is her diabetes. Doesn't take medication for it. Prediabetes diagnosed around age 26, started on Metformin, but didn't stay consistent. Recently tried back to start taking Metformin, but caused too much GI distress.   Chronic Concerns: See PMH listed below, as well as A/P for details on issues we specifically discussed during today's visit.      Past Medical History:  Diagnosis Date   Anxiety    Diabetes (HCC)    HSV-1 infection    HSV-2 (herpes simplex virus 2) infection    Hypertension    PCOS (polycystic ovarian syndrome)     Past Surgical History:  Procedure Laterality Date   WISDOM TOOTH EXTRACTION      Family History  Problem Relation Age of Onset   Hyperthyroidism Mother    Hypertension Father    Hyperthyroidism Brother    Diabetes Paternal Aunt    Cancer Maternal Grandmother    Diabetes Paternal Grandmother    Diabetes Paternal Grandfather    Heart disease Paternal Grandfather    Kidney disease Paternal Grandfather    Hypertension Paternal Grandfather     Social History   Tobacco Use   Smoking status: Never   Smokeless tobacco: Never  Vaping Use   Vaping Use: Some days   Substances: Nicotine, Flavoring  Substance Use Topics   Alcohol use: Yes    Comment: occasionally   Drug use: Yes    Types: Marijuana     No Known Allergies  Review of Systems NEGATIVE UNLESS OTHERWISE INDICATED IN HPI       Objective:     BP (!) 160/100 (BP Location: Left Arm)   Pulse 94   Temp 98 F (36.7 C) (Temporal)   Ht 5\' 4"  (1.626 m)   Wt 233 lb 3.2 oz (105.8 kg)   LMP 08/30/2022 (Exact Date)   SpO2 99%   BMI 40.03 kg/m   Wt Readings from Last 3 Encounters:  09/03/22 233 lb 3.2 oz (105.8 kg)  05/07/17 243 lb (110.2 kg)  02/28/17 245 lb (111.1 kg)    BP Readings from Last 3 Encounters:  09/03/22 (!) 160/100  07/30/22 (!) 163/104  06/18/22 (!) 151/97     Physical Exam Vitals and nursing note reviewed.  Constitutional:      Appearance: Normal appearance. She is obese. She is not toxic-appearing.  HENT:     Head: Normocephalic and atraumatic.  Eyes:     Extraocular Movements: Extraocular movements intact.     Conjunctiva/sclera: Conjunctivae normal.     Pupils: Pupils are equal, round, and reactive to light.  Cardiovascular:     Rate and Rhythm: Normal rate and regular rhythm.     Pulses: Normal pulses.     Heart sounds: Normal heart sounds.  Pulmonary:     Effort: Pulmonary effort is normal.     Breath sounds: Normal breath sounds.  Musculoskeletal:  General: Normal range of motion.     Cervical back: Normal range of motion and neck supple.  Skin:    General: Skin is warm and dry.  Neurological:     General: No focal deficit present.     Mental Status: She is alert and oriented to person, place, and time.  Psychiatric:        Mood and Affect: Mood normal.        Behavior: Behavior normal.        Assessment & Plan:  Type 2 diabetes mellitus with hyperglycemia, without long-term current use of insulin (HCC) -     CBC with Differential/Platelet -     Comprehensive metabolic panel -     Lipid panel -     TSH -     Hemoglobin A1c -     Microalbumin / creatinine urine ratio  Essential hypertension -     Comprehensive metabolic panel  PCOS (polycystic ovarian syndrome) -     CBC with Differential/Platelet -     Comprehensive metabolic panel -     TSH  HSV  (herpes simplex virus) infection  Screening for cholesterol level -     Lipid panel  Other orders -     valACYclovir HCl; Take 1 tablet (500 mg total) by mouth daily.  Dispense: 90 tablet; Refill: 1  Very pleasant 26 year old new patient establishing care today.  She has not had regular care in over a decade.  Recent visit with urgent care encouraged her to establish care.  Plan to update nonfasting labs.  She will most likely need to start on regular treatment regimen for her diabetes and hypertension.  She tried metformin and failed due to too much GI distress.  She may benefit from a GLP-1 agonist.  Encouraged healthy lifestyle changes.  Plan to have close follow-up with her and address her chronic concerns in detail over the next few visits.  Plan is on Valtrex 500 mg once daily to help with suppression of HSV (states that she has at least 2 breakouts of cold sores or more per month.)      Return in about 2 weeks (around 09/17/2022) for recheck BP, go over labs .  This note was prepared with assistance of Conservation officer, historic buildings. Occasional wrong-word or sound-a-like substitutions may have occurred due to the inherent limitations of voice recognition software.      Laressa Bolinger M Garyn Arlotta, PA-C

## 2022-09-03 NOTE — Patient Instructions (Signed)
Welcome to [facility]! It was a pleasure meeting you today.  Non-fasting labs today.  As discussed, Please schedule a 2-4 week follow up visit today.  PLEASE NOTE:  If you had any LAB tests please let us know if you have not heard back within a few days. You may see your results on MyChart before we have a chance to review them but we will give you a call once they are reviewed by [place]. If we ordered any REFERRALS today, please let us know if you have not heard from their office within the next two weeks. Let us know through MyChart if you are needing REFILLS, or have your pharmacy send [place] the request. You can also use MyChart to communicate with me or any office staff.  Please try these tips to maintain a healthy lifestyle:  Eat most of your calories during the day when you are active. Eliminate processed foods including packaged sweets (pies, cakes, cookies), reduce intake of potatoes, white bread, white pasta, and white rice. Look for whole grain options, oat flour or almond flour.  Each meal should contain half fruits/vegetables, one quarter protein, and one quarter carbs (no bigger than a computer mouse).  Cut down on sweet beverages. This includes juice, soda, and sweet tea. Also watch fruit intake, though this is a healthier sweet option, it still contains natural sugar! Limit to 3 servings daily.  Drink at least 1 glass of water with each meal and aim for at least 8 glasses (64 ounces) per day.  Exercise at least 150 minutes every week to the best of your ability.    Take Care,  Aveleen Nevers, PA-C

## 2022-09-04 ENCOUNTER — Other Ambulatory Visit: Payer: Self-pay | Admitting: Physician Assistant

## 2022-09-04 ENCOUNTER — Other Ambulatory Visit: Payer: Self-pay

## 2022-09-04 DIAGNOSIS — E1165 Type 2 diabetes mellitus with hyperglycemia: Secondary | ICD-10-CM

## 2022-09-04 MED ORDER — TIRZEPATIDE 2.5 MG/0.5ML ~~LOC~~ SOAJ: 2.5000 mg | SUBCUTANEOUS | 0 refills | Status: DC

## 2022-09-09 ENCOUNTER — Other Ambulatory Visit: Payer: Self-pay

## 2022-09-09 MED ORDER — TRULICITY 0.75 MG/0.5ML ~~LOC~~ SOAJ: 0.7500 mg | SUBCUTANEOUS | 0 refills | Status: DC

## 2022-09-09 NOTE — Telephone Encounter (Signed)
Contacted pharmacy and was advised patient has closed formulary and unable to start PA. Only medications covered for her would be Trulicity, Byetta and Bydureon, please advise what you would like to send in for patient in place of [facility].

## 2022-09-18 ENCOUNTER — Ambulatory Visit (INDEPENDENT_AMBULATORY_CARE_PROVIDER_SITE_OTHER): Payer: Commercial Managed Care - HMO | Admitting: Physician Assistant

## 2022-09-18 ENCOUNTER — Encounter: Payer: Self-pay | Admitting: Physician Assistant

## 2022-09-18 VITALS — BP 142/84 | HR 100 | Temp 97.3°F | Ht 64.0 in | Wt 233.4 lb

## 2022-09-18 DIAGNOSIS — N760 Acute vaginitis: Secondary | ICD-10-CM

## 2022-09-18 DIAGNOSIS — Z803 Family history of malignant neoplasm of breast: Secondary | ICD-10-CM | POA: Diagnosis not present

## 2022-09-18 DIAGNOSIS — E1165 Type 2 diabetes mellitus with hyperglycemia: Secondary | ICD-10-CM | POA: Diagnosis not present

## 2022-09-18 DIAGNOSIS — B9689 Other specified bacterial agents as the cause of diseases classified elsewhere: Secondary | ICD-10-CM

## 2022-09-18 DIAGNOSIS — I10 Essential (primary) hypertension: Secondary | ICD-10-CM

## 2022-09-18 MED ORDER — METRONIDAZOLE 500 MG PO TABS: 500.0000 mg | ORAL_TABLET | Freq: Two times a day (BID) | ORAL | 0 refills | Status: DC

## 2022-09-18 MED ORDER — FLUCONAZOLE 150 MG PO TABS: 150.0000 mg | ORAL_TABLET | Freq: Every day | ORAL | 0 refills | Status: DC

## 2022-09-18 MED ORDER — LOSARTAN POTASSIUM 25 MG PO TABS: 25.0000 mg | ORAL_TABLET | Freq: Every day | ORAL | 0 refills | Status: DC

## 2022-09-18 NOTE — Progress Notes (Unsigned)
   Subjective:    Patient ID: Debra Miles, female    DOB: 1995/10/29, 26 y.o.   MRN: [id number]  Chief Complaint  Patient presents with   Hypertension    Pt in office to recheck BP; discuss labs; no other concerns per pt;     HPI Patient is in today for BP and diabetes recheck. Took 2nd injection of Trulicity yesterday, no side effects. Did not tolerate metformin. Blood pressure reading at home - 166/103 on 09/04/22.   Recurrent issues with BV. Started with symptoms again about 4 days ago. Tried boric acid pills at home without relief. Discharge, odor, usually starts after intercourse.   Past Medical History:  Diagnosis Date   Anxiety    Diabetes (HCC)    HSV-1 infection    HSV-2 (herpes simplex virus 2) infection    Hypertension    PCOS (polycystic ovarian syndrome)     Past Surgical History:  Procedure Laterality Date   WISDOM TOOTH EXTRACTION      Family History  Problem Relation Age of Onset   Hyperthyroidism Mother    Hypertension Father    Hyperthyroidism Brother    Diabetes Paternal Aunt    Cancer Maternal Grandmother    Diabetes Paternal Grandmother    Diabetes Paternal Grandfather    Heart disease Paternal Grandfather    Kidney disease Paternal Grandfather    Hypertension Paternal Grandfather     Social History   Tobacco Use   Smoking status: Never   Smokeless tobacco: Never  Vaping Use   Vaping Use: Some days   Substances: Nicotine, Flavoring  Substance Use Topics   Alcohol use: Yes    Comment: occasionally   Drug use: Yes    Types: Marijuana     No Known Allergies  Review of Systems NEGATIVE UNLESS OTHERWISE INDICATED IN HPI      Objective:     BP (!) 142/84 (BP Location: Right Arm) Comment (BP Location): manually  Pulse 100   Temp (!) 97.3 F (36.3 C) (Temporal)   Ht 5\' 4"  (1.626 m)   Wt 233 lb 6.4 oz (105.9 kg)   LMP 08/30/2022 (Exact Date)   SpO2 97%   BMI 40.06 kg/m   Wt Readings from Last 3 Encounters:  09/18/22 233  lb 6.4 oz (105.9 kg)  09/03/22 233 lb 3.2 oz (105.8 kg)  05/07/17 243 lb (110.2 kg)    BP Readings from Last 3 Encounters:  09/18/22 (!) 142/84  09/03/22 (!) 160/100  07/30/22 (!) 163/104     Physical Exam     Assessment & Plan:  There are no diagnoses linked to this encounter.      No follow-ups on file.  This note was prepared with assistance of 08/01/22. Occasional wrong-word or sound-a-like substitutions may have occurred due to the inherent limitations of voice recognition software.  Time Spent: *** minutes of total time was spent on the date of the encounter performing the following actions: chart review prior to seeing the patient, obtaining history, performing a medically necessary exam, counseling on the treatment plan, placing orders, and documenting in our EHR.       Wendie Diskin M Furman Trentman, PA-C

## 2022-09-19 DIAGNOSIS — N760 Acute vaginitis: Secondary | ICD-10-CM | POA: Insufficient documentation

## 2022-09-19 DIAGNOSIS — Z803 Family history of malignant neoplasm of breast: Secondary | ICD-10-CM | POA: Insufficient documentation

## 2022-09-19 DIAGNOSIS — B9689 Other specified bacterial agents as the cause of diseases classified elsewhere: Secondary | ICD-10-CM | POA: Insufficient documentation

## 2022-09-19 NOTE — Assessment & Plan Note (Signed)
Recurrent issues with BV.  Currently has an acute infection.  She does well with metronidazole.  Rx sent in to take 500 mg twice daily for the next week.  She knows not to consume with alcohol.  Prescription for Diflucan to take after completion of antibiotic course.

## 2022-09-19 NOTE — Assessment & Plan Note (Signed)
Currently uncontrolled, above goal.  Plan to start on losartan 25 mg once daily.  Possible side effects discussed with patient.  Asked her to try to monitor at home.  Lifestyle changes encouraged to help reduce blood pressure readings.

## 2022-09-19 NOTE — Assessment & Plan Note (Signed)
2 paternal aunts with history of breast cancer.  Patient is requesting referral at this time.

## 2022-09-19 NOTE — Assessment & Plan Note (Signed)
Patient has started on Trulicity 0.75 mg once weekly.  Reports to be doing well so far.  No problems.  Encouraged her to keep working on lifestyle.

## 2022-10-08 ENCOUNTER — Ambulatory Visit (INDEPENDENT_AMBULATORY_CARE_PROVIDER_SITE_OTHER): Payer: 59 | Admitting: Physician Assistant

## 2022-10-08 ENCOUNTER — Encounter: Payer: Self-pay | Admitting: Physician Assistant

## 2022-10-08 VITALS — BP 136/84 | HR 76 | Temp 97.7°F | Ht 64.0 in | Wt 232.6 lb

## 2022-10-08 DIAGNOSIS — J0110 Acute frontal sinusitis, unspecified: Secondary | ICD-10-CM | POA: Diagnosis not present

## 2022-10-08 DIAGNOSIS — I10 Essential (primary) hypertension: Secondary | ICD-10-CM | POA: Insufficient documentation

## 2022-10-08 DIAGNOSIS — E282 Polycystic ovarian syndrome: Secondary | ICD-10-CM | POA: Insufficient documentation

## 2022-10-08 MED ORDER — TRULICITY 0.75 MG/0.5ML ~~LOC~~ SOAJ: 1.5000 mg | SUBCUTANEOUS | 2 refills | Status: DC

## 2022-10-08 MED ORDER — FLUTICASONE PROPIONATE 50 MCG/ACT NA SUSP: 2.0000 | Freq: Every day | NASAL | 3 refills | Status: AC

## 2022-10-08 MED ORDER — AMOXICILLIN-POT CLAVULANATE 875-125 MG PO TABS: 1.0000 | ORAL_TABLET | Freq: Two times a day (BID) | ORAL | 0 refills | Status: DC

## 2022-10-08 NOTE — Progress Notes (Signed)
Subjective:    Patient ID: Debra Miles, female    DOB: Jan 18, 1996, 27 y.o.   MRN: [id number]  Chief Complaint  Patient presents with   Nasal Congestion    Pt c/o congestion, sore throat, stuffy, Negative for Covid, no fever, little cough no production; not taking anything OTC at this time; headache is 6 out of 10    HPI Patient is in today for acute sickness x 5 days.  Started with headache and ST. Still having head pressure across front of head, stabbing pain behind her eyes. No fever or chills. Sinus congestion and pressure. Some coughing. No body aches. No n/v/d or abdominal pain.  Nothing OTC so far.  Needs Trulicity refilled also- tolerating well.   Past Medical History:  Diagnosis Date   Anxiety    Diabetes (HCC)    HSV-1 infection    HSV-2 (herpes simplex virus 2) infection    Hypertension    PCOS (polycystic ovarian syndrome)     Past Surgical History:  Procedure Laterality Date   WISDOM TOOTH EXTRACTION      Family History  Problem Relation Age of Onset   Hyperthyroidism Mother    Hypertension Father    Hyperthyroidism Brother    Cancer Maternal Grandmother    Diabetes Paternal Grandmother    Diabetes Paternal Grandfather    Heart disease Paternal Grandfather    Kidney disease Paternal Grandfather    Hypertension Paternal Grandfather    Diabetes Paternal Aunt    Breast cancer Paternal Aunt    Breast cancer Paternal Aunt     Social History   Tobacco Use   Smoking status: Never   Smokeless tobacco: Never  Vaping Use   Vaping Use: Some days   Substances: Nicotine, Flavoring  Substance Use Topics   Alcohol use: Yes    Comment: occasionally   Drug use: Yes    Types: Marijuana     No Known Allergies  Review of Systems NEGATIVE UNLESS OTHERWISE INDICATED IN HPI      Objective:     BP 136/84 (BP Location: Left Arm)   Pulse 76   Temp 97.7 F (36.5 C) (Temporal)   Ht 5\' 4"  (1.626 m)   Wt 232 lb 9.6 oz (105.5 kg)   SpO2 98%   BMI  39.93 kg/m   Wt Readings from Last 3 Encounters:  10/08/22 232 lb 9.6 oz (105.5 kg)  09/18/22 233 lb 6.4 oz (105.9 kg)  09/03/22 233 lb 3.2 oz (105.8 kg)    BP Readings from Last 3 Encounters:  10/08/22 136/84  09/18/22 (!) 142/84  09/03/22 (!) 160/100     Physical Exam Vitals and nursing note reviewed.  Constitutional:      General: She is not in acute distress.    Appearance: Normal appearance. She is ill-appearing.  HENT:     Head: Normocephalic.     Right Ear: Tympanic membrane, ear canal and external ear normal.     Left Ear: Tympanic membrane, ear canal and external ear normal.     Nose: Congestion present.     Right Turbinates: Enlarged.     Left Turbinates: Enlarged.     Right Sinus: Frontal sinus tenderness present.     Left Sinus: Frontal sinus tenderness present.     Mouth/Throat:     Mouth: Mucous membranes are moist.     Pharynx: No oropharyngeal exudate or posterior oropharyngeal erythema.  Eyes:     Extraocular Movements: Extraocular movements intact.  Conjunctiva/sclera: Conjunctivae normal.     Pupils: Pupils are equal, round, and reactive to light.  Cardiovascular:     Rate and Rhythm: Normal rate and regular rhythm.     Pulses: Normal pulses.     Heart sounds: Normal heart sounds. No murmur heard. Pulmonary:     Effort: Pulmonary effort is normal. No respiratory distress.     Breath sounds: Normal breath sounds. No wheezing.  Musculoskeletal:     Cervical back: Normal range of motion.  Skin:    General: Skin is warm.  Neurological:     Mental Status: She is alert and oriented to person, place, and time.  Psychiatric:        Mood and Affect: Mood normal.        Behavior: Behavior normal.        Assessment & Plan:  Acute frontal sinusitis, recurrence not specified  Other orders -     Trulicity; Inject 1.5 mg into the skin once a week.  Dispense: 2 mL; Refill: 2 -     Amoxicillin-Pot Clavulanate; Take 1 tablet by mouth 2 (two) times  daily. Take with food.  Dispense: 20 tablet; Refill: 0 -     Fluticasone Propionate; Place 2 sprays into both nostrils daily.  Dispense: 16 g; Refill: 3    Persistent symptoms despite conservative efforts at home. Will Rx Augmentin at this time, take with food. Flonase for added relief. Cautioned on antibiotic use and possible side effects. Advised nasal saline, humidifier, and pushing fluids. Call if worse or no improvement.      Return if symptoms worsen or fail to improve.     Secret Kristensen M Khayden Herzberg, PA-C

## 2022-10-09 NOTE — Telephone Encounter (Signed)
Please see pt response and advise 

## 2022-10-10 ENCOUNTER — Ambulatory Visit: Payer: Commercial Managed Care - HMO | Admitting: Physician Assistant

## 2022-10-11 ENCOUNTER — Other Ambulatory Visit: Payer: Self-pay | Admitting: Physician Assistant

## 2022-10-11 MED ORDER — METFORMIN HCL ER 500 MG PO TB24: 1000.0000 mg | ORAL_TABLET | Freq: Two times a day (BID) | ORAL | 2 refills | Status: DC

## 2022-10-20 ENCOUNTER — Other Ambulatory Visit: Payer: Self-pay | Admitting: Physician Assistant

## 2022-10-22 NOTE — Telephone Encounter (Signed)
I've made several attempts to call patient and lvm for patient with no response or return calls to get an alternate pharmacy to send prescription. Please advise

## 2022-10-28 NOTE — Telephone Encounter (Signed)
Please advise 

## 2022-10-30 ENCOUNTER — Telehealth (INDEPENDENT_AMBULATORY_CARE_PROVIDER_SITE_OTHER): Payer: 59 | Admitting: Physician Assistant

## 2022-10-30 VITALS — Ht 64.0 in | Wt 232.0 lb

## 2022-10-30 DIAGNOSIS — N760 Acute vaginitis: Secondary | ICD-10-CM

## 2022-10-30 MED ORDER — FLUCONAZOLE 150 MG PO TABS: 150.0000 mg | ORAL_TABLET | Freq: Every day | ORAL | 1 refills | Status: DC

## 2022-10-30 MED ORDER — METRONIDAZOLE 500 MG PO TABS: 500.0000 mg | ORAL_TABLET | Freq: Two times a day (BID) | ORAL | 0 refills | Status: DC

## 2022-10-30 NOTE — Progress Notes (Signed)
   Virtual Visit via Video Note  I connected with  Debra Miles  on 10/30/22 at 11:45 AM EST by a video enabled telemedicine application and verified that I am speaking with the correct person using two identifiers.  Location: Patient: home Provider: Therapist, music at [place] present: Patient and myself   I discussed the limitations of evaluation and management by telemedicine and the availability of in person appointments. The patient expressed understanding and agreed to proceed.   History of Present Illness:  27 year old female presents for virtual visit today. Vaginal discharge x 3 days. Fishy odor + itching. White discharge. She was treated for yeast and BV in October 2023, symptoms went away after treatment. No concern for STIs per patient. LMP was 27 days ago.   Observations/Objective:   Gen: Awake, alert, no acute distress Resp: Breathing is even and non-labored Psych: calm/pleasant demeanor Neuro: Alert and Oriented x 3, + facial symmetry, speech is clear.   Assessment and Plan:  1. Acute vaginitis Symptoms consistent with her recent history of bacterial vaginosis and vaginal yeast infection.  She is not concerned about STIs and therefore we are deferring testing and treatment for this.  Plan to treat with Flagyl 500 mg twice daily x 7 days.  Patient knows not to use alcohol with this medication.  She will also take Diflucan as directed.  Patient will follow-up in office if worse or no improvement of symptoms.   Follow Up Instructions:    I discussed the assessment and treatment plan with the patient. The patient was provided an opportunity to ask questions and all were answered. The patient agreed with the plan and demonstrated an understanding of the instructions.   The patient was advised to call back or seek an in-person evaluation if the symptoms worsen or if the condition fails to improve as anticipated.  Brylie Sneath M Chrishauna Mee, PA-C

## 2022-11-30 ENCOUNTER — Ambulatory Visit
Admission: EM | Admit: 2022-11-30 | Discharge: 2022-11-30 | Disposition: A | Discharge status: Home / Self Care | Payer: 59 | Attending: Urgent Care | Admitting: Urgent Care

## 2022-11-30 DIAGNOSIS — R52 Pain, unspecified: Secondary | ICD-10-CM

## 2022-11-30 DIAGNOSIS — S161XXA Strain of muscle, fascia and tendon at neck level, initial encounter: Secondary | ICD-10-CM

## 2022-11-30 DIAGNOSIS — M542 Cervicalgia: Secondary | ICD-10-CM

## 2022-11-30 MED ORDER — NAPROXEN 500 MG PO TABS: 500.0000 mg | ORAL_TABLET | Freq: Two times a day (BID) | ORAL | 0 refills | Status: DC

## 2022-11-30 MED ORDER — CYCLOBENZAPRINE HCL 5 MG PO TABS: 5.0000 mg | ORAL_TABLET | Freq: Every evening | ORAL | 0 refills | Status: DC | PRN
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
## 2022-12-16 ENCOUNTER — Other Ambulatory Visit: Payer: Self-pay | Admitting: Physician Assistant

## 2023-01-01 ENCOUNTER — Other Ambulatory Visit: Payer: Self-pay | Admitting: Physician Assistant

## 2023-06-07 ENCOUNTER — Emergency Department: Payer: 59

## 2023-06-07 ENCOUNTER — Other Ambulatory Visit: Payer: Self-pay

## 2023-06-07 ENCOUNTER — Emergency Department
Admission: EM | Admit: 2023-06-07 | Discharge: 2023-06-07 | Disposition: A | Discharge status: Home / Self Care | Payer: 59 | Attending: Emergency Medicine | Admitting: Emergency Medicine

## 2023-06-07 DIAGNOSIS — N83202 Unspecified ovarian cyst, left side: Secondary | ICD-10-CM | POA: Diagnosis not present

## 2023-06-07 DIAGNOSIS — N83201 Unspecified ovarian cyst, right side: Secondary | ICD-10-CM | POA: Insufficient documentation

## 2023-06-07 DIAGNOSIS — R1030 Lower abdominal pain, unspecified: Secondary | ICD-10-CM | POA: Diagnosis present

## 2023-06-07 LAB — URINALYSIS, ROUTINE W REFLEX MICROSCOPIC
Bilirubin Urine: NEGATIVE
Glucose, UA: >=500 mg/dL — AB
Hgb urine dipstick: NEGATIVE
Ketones, ur: NEGATIVE mg/dL
Leukocytes,Ua: NEGATIVE
Nitrite: NEGATIVE
Protein, ur: 100 mg/dL — AB
Specific Gravity, Urine: 1.039 — ABNORMAL HIGH (ref 1.005–1.030)
pH: 5 (ref 5.0–8.0)

## 2023-06-07 LAB — LIPASE, BLOOD: Lipase: 37 U/L (ref 11–51)

## 2023-06-07 LAB — CBC
HCT: 47.2 % — ABNORMAL HIGH (ref 36.0–46.0)
Hemoglobin: 16.4 g/dL — ABNORMAL HIGH (ref 12.0–15.0)
MCH: 29.2 pg (ref 26.0–34.0)
MCHC: 34.7 g/dL (ref 30.0–36.0)
MCV: 84.1 fL (ref 80.0–100.0)
Platelets: 449 10*3/uL — ABNORMAL HIGH (ref 150–400)
RBC: 5.61 MIL/uL — ABNORMAL HIGH (ref 3.87–5.11)
RDW: 12.2 % (ref 11.5–15.5)
WBC: 15.3 10*3/uL — ABNORMAL HIGH (ref 4.0–10.5)
nRBC: 0 % (ref 0.0–0.2)

## 2023-06-07 LAB — COMPREHENSIVE METABOLIC PANEL
ALT: 24 U/L (ref 0–44)
AST: 22 U/L (ref 15–41)
Albumin: 4 g/dL (ref 3.5–5.0)
Alkaline Phosphatase: 78 U/L (ref 38–126)
Anion gap: 13 (ref 5–15)
BUN: 12 mg/dL (ref 6–20)
CO2: 24 mmol/L (ref 22–32)
Calcium: 9 mg/dL (ref 8.9–10.3)
Chloride: 96 mmol/L — ABNORMAL LOW (ref 98–111)
Creatinine, Ser: 0.75 mg/dL (ref 0.44–1.00)
GFR, Estimated: >60 mL/min (ref >60)
Glucose, Bld: 304 mg/dL — ABNORMAL HIGH (ref 70–99)
Potassium: 3.7 mmol/L (ref 3.5–5.1)
Sodium: 133 mmol/L — ABNORMAL LOW (ref 135–145)
Total Bilirubin: 0.8 mg/dL (ref 0.3–1.2)
Total Protein: 8.2 g/dL — ABNORMAL HIGH (ref 6.5–8.1)

## 2023-06-07 LAB — POC URINE PREG, ED: Preg Test, Ur: NEGATIVE

## 2023-06-07 MED ORDER — SODIUM CHLORIDE 0.9 % IV BOLUS
1000.0000 mL | Freq: Once | INTRAVENOUS | Status: AC
  Administered 2023-06-07: 1000 mL via INTRAVENOUS

## 2023-06-07 MED ORDER — IOHEXOL 300 MG/ML  SOLN
100.0000 mL | Freq: Once | INTRAMUSCULAR | Status: AC | PRN
  Administered 2023-06-07: 100 mL via INTRAVENOUS

## 2023-06-07 NOTE — ED Provider Notes (Signed)
[facility] Provider Note   Event Date/Time   First MD Initiated Contact with Patient 06/07/23 1658     (approximate) History  Abdominal Pain  HPI Debra Miles is a 27 y.o. female with a stated past medical history of PCOS who presents for generalized abdominal pain that is 8/10 in severity and worse in the left lower quadrant.  Patient states that this pain was extremely severe and so an onset at approximately noon today.  Patient states that after onset the symptoms have been mildly improving.  Patient denies any exacerbating or relieving factors.  Patient denies ever having symptoms similar to this in the past. ROS: Patient currently denies any vision changes, tinnitus, difficulty speaking, facial droop, sore throat, chest pain, shortness of breath, nausea/vomiting/diarrhea, dysuria, or weakness/numbness/paresthesias in any extremity   Physical Exam  Triage Vital Signs: ED Triage Vitals [06/07/23 1654]  Encounter Vitals Group     BP (!) 160/115     Systolic BP Percentile      Diastolic BP Percentile      Pulse Rate (!) 110     Resp 16     Temp 98.5 F (36.9 C)     Temp Source Oral     SpO2 98 %     Weight 245 lb (111.1 kg)     Height 5\' 5"  (1.651 m)     Head Circumference      Peak Flow      Pain Score 8     Pain Loc      Pain Education      Exclude from Growth Chart    Most recent vital signs: Vitals:   06/07/23 1704 06/07/23 1913  BP: (!) 166/109 (!) 141/106  Pulse: (!) 111   Resp: (!) 24   Temp:    SpO2: 98%    General: Awake, oriented x4. CV:  Good peripheral perfusion.  Resp:  Normal effort.  Abd:  No distention.  Other:  Young adult obese Caucasian female resting comfortably in no acute distress ED Results / Procedures / Treatments  Labs (all labs ordered are listed, but only abnormal results are displayed) Labs Reviewed  COMPREHENSIVE METABOLIC PANEL - Abnormal; Notable for the following components:      Result Value    Sodium 133 (*)    Chloride 96 (*)    Glucose, Bld 304 (*)    Total Protein 8.2 (*)    All other components within normal limits  CBC - Abnormal; Notable for the following components:   WBC 15.3 (*)    RBC 5.61 (*)    Hemoglobin 16.4 (*)    HCT 47.2 (*)    Platelets 449 (*)    All other components within normal limits  URINALYSIS, ROUTINE W REFLEX MICROSCOPIC - Abnormal; Notable for the following components:   Color, Urine YELLOW (*)    APPearance HAZY (*)    Specific Gravity, Urine 1.039 (*)    Glucose, UA >=500 (*)    Protein, ur 100 (*)    Bacteria, UA RARE (*)    All other components within normal limits  LIPASE, BLOOD  POC URINE PREG, ED   RADIOLOGY ED MD interpretation: CT of the abdomen and pelvis with IV contrast shows no acute intra-abdominal or pelvic pathology.  No bowel obstruction.  And a normal appendix.  There is a an incidentally found 15 mm right ovarian dominant follicle or corpus luteum as well as multiple punctate gallstones -Agree with radiology  assessment Official radiology report(s): CT ABDOMEN PELVIS W CONTRAST  Result Date: 06/07/2023 CLINICAL DATA:  Left lower quadrant abdominal pain. EXAM: CT ABDOMEN AND PELVIS WITH CONTRAST TECHNIQUE: Multidetector CT imaging of the abdomen and pelvis was performed using the standard protocol following bolus administration of intravenous contrast. RADIATION DOSE REDUCTION: This exam was performed according to the departmental dose-optimization program which includes automated exposure control, adjustment of the mA and/or kV according to patient size and/or use of iterative reconstruction technique. CONTRAST:  OMNIPAQUE IOHEXOL 300 MG/ML  SOLN COMPARISON:  Abdominal ultrasound dated 11/20/2014. FINDINGS: Lower chest: The visualized lung bases are clear. No intra-abdominal free air or free fluid. Hepatobiliary: Fatty liver. Indeterminate 15 mm enhancing focus in the left lobe of the liver may represent a flash filling  hemangioma or a portal venous shunting. MRI may provide better evaluation on a nonemergent/outpatient basis. No biliary dilatation. Tiny stones may be present within the gallbladder. No pericholecystic fluid or evidence of acute cholecystitis by CT. Pancreas: Unremarkable. No pancreatic ductal dilatation or surrounding inflammatory changes. Spleen: Normal in size without focal abnormality. Adrenals/Urinary Tract: The adrenal glands are unremarkable. The kidneys, visualized ureters, and urinary bladder appear unremarkable. Stomach/Bowel: There is no bowel obstruction or active inflammation. The appendix is normal. Vascular/Lymphatic: The abdominal aorta and IVC are unremarkable. No portal venous gas. There is no adenopathy. Reproductive: The uterus is retroflexed. There is a 15 mm right ovarian dominant follicle or corpus luteum. No imaging follow-up. The left ovary is unremarkable. Other: None Musculoskeletal: No acute or significant osseous findings. IMPRESSION: 1. No acute intra-abdominal or pelvic pathology. No bowel obstruction. Normal appendix. 2. A 15 mm right ovarian dominant follicle or corpus luteum. 3. Fatty liver. 4. Possible tiny gallstones. Electronically Signed   By: Elgie Collard M.D.   On: 06/07/2023 19:20   PROCEDURES: Critical Care performed: No .1-3 Lead EKG Interpretation  Performed by: Merwyn Katos, MD Authorized by: Merwyn Katos, MD     Interpretation: normal     ECG rate:  91   ECG rate assessment: normal     Rhythm: sinus rhythm     Ectopy: none     Conduction: normal    MEDICATIONS ORDERED IN ED: Medications  iohexol (OMNIPAQUE) 300 MG/ML solution 100 mL (100 mLs Intravenous Contrast Given 06/07/23 1812)  sodium chloride 0.9 % bolus 1,000 mL (0 mLs Intravenous Stopped 06/07/23 2049)   IMPRESSION / MDM / ASSESSMENT AND PLAN / ED COURSE  I reviewed the triage vital signs and the nursing notes.                             The patient is on the cardiac monitor to  evaluate for evidence of arrhythmia and/or significant heart rate changes. Patient's presentation is most consistent with acute presentation with potential threat to life or bodily function. Patient's symptoms not typical for emergent causes of abdominal pain such as, but not limited to, appendicitis, abdominal aortic aneurysm, surgical biliary disease, pancreatitis, SBO, mesenteric ischemia, serious intra-abdominal bacterial illness. Presentation also not typical of gynecologic emergencies such as TOA, Ovarian Torsion, PID. Not Ectopic. Doubt atypical ACS. Given her history of PCOS, it is likely patient may have had a small ruptured cyst.  Patient is encouraged to follow-up with her OB/GYN for further evaluation if pain persists Pt tolerating PO. Disposition: Patient will be discharged with strict return precautions and follow up with primary MD within 12-24 hours for  further evaluation. Patient understands that this still may have an early presentation of an emergent medical condition such as appendicitis that will require a recheck.   FINAL CLINICAL IMPRESSION(S) / ED DIAGNOSES   Final diagnoses:  Lower abdominal pain  Bilateral ovarian cysts   Rx / DC Orders   ED Discharge Orders     None      Note:  This document was prepared using Dragon voice recognition software and may include unintentional dictation errors.   Merwyn Katos, MD 06/07/23 2350

## 2023-06-07 NOTE — ED Triage Notes (Signed)
Pt to ed from home via POV for abd pain that started yesterday. Pt advised she has PCOS and is concerned a cyst might have ruptured. Pt is caox4, in no acute distress and ambulatory in triage.

## 2023-06-09 NOTE — Group Note (Deleted)

## 2023-07-03 ENCOUNTER — Other Ambulatory Visit: Payer: Self-pay | Admitting: Physician Assistant

## 2023-07-03 ENCOUNTER — Ambulatory Visit (INDEPENDENT_AMBULATORY_CARE_PROVIDER_SITE_OTHER): Payer: 59 | Admitting: Physician Assistant

## 2023-07-03 ENCOUNTER — Encounter: Payer: Self-pay | Admitting: Physician Assistant

## 2023-07-03 VITALS — BP 164/94 | HR 92 | Temp 98.0°F | Ht 65.0 in | Wt 233.8 lb

## 2023-07-03 DIAGNOSIS — R809 Proteinuria, unspecified: Secondary | ICD-10-CM | POA: Diagnosis not present

## 2023-07-03 DIAGNOSIS — K760 Fatty (change of) liver, not elsewhere classified: Secondary | ICD-10-CM

## 2023-07-03 DIAGNOSIS — K8020 Calculus of gallbladder without cholecystitis without obstruction: Secondary | ICD-10-CM | POA: Diagnosis not present

## 2023-07-03 DIAGNOSIS — J209 Acute bronchitis, unspecified: Secondary | ICD-10-CM

## 2023-07-03 DIAGNOSIS — E1165 Type 2 diabetes mellitus with hyperglycemia: Secondary | ICD-10-CM

## 2023-07-03 DIAGNOSIS — I10 Essential (primary) hypertension: Secondary | ICD-10-CM

## 2023-07-03 DIAGNOSIS — Z7985 Long-term (current) use of injectable non-insulin antidiabetic drugs: Secondary | ICD-10-CM | POA: Diagnosis not present

## 2023-07-03 DIAGNOSIS — D582 Other hemoglobinopathies: Secondary | ICD-10-CM

## 2023-07-03 DIAGNOSIS — Z23 Encounter for immunization: Secondary | ICD-10-CM

## 2023-07-03 DIAGNOSIS — F4321 Adjustment disorder with depressed mood: Secondary | ICD-10-CM

## 2023-07-03 LAB — POCT URINALYSIS DIPSTICK
Bilirubin, UA: NEGATIVE
Blood, UA: NEGATIVE
Glucose, UA: POSITIVE — AB
Ketones, UA: NEGATIVE
Leukocytes, UA: NEGATIVE
Nitrite, UA: NEGATIVE
Protein, UA: NEGATIVE
Spec Grav, UA: 1.015 (ref 1.010–1.025)
Urobilinogen, UA: 0.2 U/dL
pH, UA: 6 (ref 5.0–8.0)

## 2023-07-03 LAB — POCT GLYCOSYLATED HEMOGLOBIN (HGB A1C): Hemoglobin A1C: 10.3 % — AB (ref 4.0–5.6)

## 2023-07-03 MED ORDER — TIRZEPATIDE 2.5 MG/0.5ML ~~LOC~~ SOAJ: 2.5000 mg | SUBCUTANEOUS | 1 refills | Status: DC

## 2023-07-03 MED ORDER — AMOXICILLIN-POT CLAVULANATE 875-125 MG PO TABS
1.0000 | ORAL_TABLET | Freq: Two times a day (BID) | ORAL | 0 refills | Status: AC
Start: 2023-07-03 — End: 2023-07-10

## 2023-07-03 MED ORDER — OLMESARTAN MEDOXOMIL-HCTZ 20-12.5 MG PO TABS: 1.0000 | ORAL_TABLET | Freq: Every day | ORAL | 2 refills | Status: DC

## 2023-07-03 MED ORDER — PREDNISONE 20 MG PO TABS
20.0000 mg | ORAL_TABLET | Freq: Two times a day (BID) | ORAL | 0 refills | Status: AC
Start: 2023-07-03 — End: 2023-07-07

## 2023-07-03 MED ORDER — OZEMPIC (0.25 OR 0.5 MG/DOSE) 2 MG/3ML ~~LOC~~ SOPN
PEN_INJECTOR | SUBCUTANEOUS | 0 refills | Status: AC
Start: 2023-07-03 — End: 2023-08-27

## 2023-07-03 NOTE — Progress Notes (Signed)
Subjective:    Patient ID: Debra Miles, female    DOB: December 03, 1995, 27 y.o.   MRN: [id number]  Chief Complaint  Patient presents with   Follow-up    Pt in the office for ED f/u; pt states lower abdominal pain; was told BIL ovarian cysts; Not been compliant with medications; Metformin making patient extremely sick and not been taking, also Trulicity not covered by insurance; Pt due for labs and A1C check; pt has been congested and head cold for 1 week;     HPI Patient is in today for ED f/up from 06/07/23.  Pt thought she had a stomach ulcer, it was a "burning pain" across her abdomen. Present for 2-3 days before she went to the ED. She started menstrual cycle three days later, this was her LMP - very heavy and clotting, miserable cycle, finished in about 2-3 days.   Stomach is not hurting today. Feels back to normal, otherwise just has a head cold.  Stopped taking Metformin in February due to recurrent GI distress.  Stopped BP medication in April, due to heavy drinking (has since quit). Pt lost her aunt, who was like a mother to her, and living with her; has been struggling since this loss.     Past Medical History:  Diagnosis Date   Anxiety    Diabetes ([facility])    HSV-1 infection    HSV-2 (herpes simplex virus 2) infection    Hypertension    PCOS (polycystic ovarian syndrome)     Past Surgical History:  Procedure Laterality Date   WISDOM TOOTH EXTRACTION      Family History  Problem Relation Age of Onset   Hyperthyroidism Mother    Hypertension Father    Hyperthyroidism Brother    Cancer Maternal Grandmother    Diabetes Paternal Grandmother    Diabetes Paternal Grandfather    Heart disease Paternal Grandfather    Kidney disease Paternal Grandfather    Hypertension Paternal Grandfather    Diabetes Paternal Aunt    Breast cancer Paternal Aunt    Breast cancer Paternal Aunt        "mom" to her - passed in April 2024 after pneumonia    Social History   Tobacco  Use   Smoking status: Never   Smokeless tobacco: Never  Vaping Use   Vaping status: Some Days   Substances: Nicotine, Flavoring  Substance Use Topics   Alcohol use: Yes    Comment: occasionally   Drug use: Yes    Types: Marijuana     Allergies  Allergen Reactions   Metformin And Related Diarrhea and Nausea And Vomiting    Review of Systems NEGATIVE UNLESS OTHERWISE INDICATED IN HPI      Objective:     BP (!) 164/94 (BP Location: Left Arm)   Pulse 92   Temp 98 F (36.7 C) (Temporal)   Ht 5\' 5"  (1.651 m)   Wt 233 lb 12.8 oz (106.1 kg)   LMP 06/11/2023 (Exact Date) Comment: heg preg test 06/07/23  SpO2 98%   BMI 38.91 kg/m   Wt Readings from Last 3 Encounters:  07/03/23 233 lb 12.8 oz (106.1 kg)  06/07/23 245 lb (111.1 kg)  10/30/22 232 lb (105.2 kg)    BP Readings from Last 3 Encounters:  07/03/23 (!) 164/94  06/07/23 (!) 141/106  11/30/22 (!) 140/94     Physical Exam Vitals and nursing note reviewed.  Constitutional:      General: She is not in acute  distress.    Appearance: Normal appearance. She is not ill-appearing.  HENT:     Head: Normocephalic.     Right Ear: Tympanic membrane, ear canal and external ear normal.     Left Ear: Tympanic membrane, ear canal and external ear normal.     Nose: Congestion present.     Mouth/Throat:     Mouth: Mucous membranes are moist.     Pharynx: No oropharyngeal exudate or posterior oropharyngeal erythema.  Eyes:     Extraocular Movements: Extraocular movements intact.     Conjunctiva/sclera: Conjunctivae normal.     Pupils: Pupils are equal, round, and reactive to light.  Cardiovascular:     Rate and Rhythm: Normal rate and regular rhythm.     Pulses: Normal pulses.     Heart sounds: Normal heart sounds. No murmur heard. Pulmonary:     Effort: Pulmonary effort is normal. No respiratory distress.     Breath sounds: Wheezing and rhonchi present.  Musculoskeletal:     Cervical back: Normal range of motion.   Skin:    General: Skin is warm.  Neurological:     Mental Status: She is alert and oriented to person, place, and time.  Psychiatric:        Mood and Affect: Mood normal.        Behavior: Behavior normal.     Comments: Tearful at times        Assessment & Plan:  Type 2 diabetes mellitus with hyperglycemia, without long-term current use of insulin (HCC) Assessment & Plan: Lab Results  Component Value Date   HGBA1C 10.3 (A) 07/03/2023   HGBA1C 9.9 (H) 09/03/2022   Worsening, non-compliant the last 6 months due to grief. Plan to work with her to get her back on track.  Couldn't tolerate Metformin due to severe GI distress. Will try to get [facility] or Ozempic approved.  Work on lifestyle changes.   Orders: -     POCT glycosylated hemoglobin (Hb A1C) -     CBC with Differential/Platelet -     Comprehensive metabolic panel -     POCT urinalysis dipstick -     Tirzepatide; Inject 2.5 mg into the skin once a week.  Dispense: 2 mL; Refill: 1 -     Ozempic (0.25 or 0.5 MG/DOSE); Inject 0.25 mg into the skin once a week for 28 days, THEN 0.5 mg once a week for 28 days.  Dispense: 6 mL; Refill: 0  Fatty liver -     Comprehensive metabolic panel -     Ozempic (4.78 or 0.5 MG/DOSE); Inject 0.25 mg into the skin once a week for 28 days, THEN 0.5 mg once a week for 28 days.  Dispense: 6 mL; Refill: 0  Immunization due -     Flu vaccine trivalent PF, 6mos and older(Flulaval,Afluria,Fluarix,Fluzone)  Gallstones -     Comprehensive metabolic panel  Elevated hemoglobin (HCC) -     CBC with Differential/Platelet  Proteinuria, unspecified type -     POCT urinalysis dipstick  Essential hypertension Assessment & Plan: Uncontrolled, non-compliant lately with medications.  Start on Benicar hydrochlorothiazide 20-12.5 mg daily.  Pt aware of risks vs benefits and possible adverse reactions. Lifestyle changes. Monitor at home.   Orders: -     Olmesartan Medoxomil-HCTZ; Take 1 tablet  by mouth daily.  Dispense: 30 tablet; Refill: 2  Acute bronchitis with wheezing -     predniSONE; Take 1 tablet (20 mg total) by mouth 2 (two) times  daily with a meal for 4 days.  Dispense: 8 tablet; Refill: 0 -     Amoxicillin-Pot Clavulanate; Take 1 tablet by mouth 2 (two) times daily for 7 days. Take with food.  Dispense: 14 tablet; Refill: 0  Grief  --Sudden loss of her aunt "mom" to pneumonia; had been battling breast cancer as well. Pt declines need for medication or counseling at this time, but will consider it.       Return in about 6 weeks (around 08/14/2023) for recheck/follow-up.     Odelle Kosier M Tyris Eliot, PA-C

## 2023-07-03 NOTE — Telephone Encounter (Signed)
Alternative requested for [facility], this isn't on formulary for patient

## 2023-07-04 ENCOUNTER — Encounter: Payer: Self-pay | Admitting: Physician Assistant

## 2023-07-04 LAB — CBC WITH DIFFERENTIAL/PLATELET
Basophils Absolute: 0.1 10*3/uL (ref 0.0–0.1)
Basophils Relative: 1.1 % (ref 0.0–3.0)
Eosinophils Absolute: 0.3 10*3/uL (ref 0.0–0.7)
Eosinophils Relative: 3 % (ref 0.0–5.0)
HCT: 43 % (ref 36.0–46.0)
Hemoglobin: 14.5 g/dL (ref 12.0–15.0)
Lymphocytes Relative: 29.4 % (ref 12.0–46.0)
Lymphs Abs: 2.9 10*3/uL (ref 0.7–4.0)
MCHC: 33.7 g/dL (ref 30.0–36.0)
MCV: 85.9 fL (ref 78.0–100.0)
Monocytes Absolute: 0.5 10*3/uL (ref 0.1–1.0)
Monocytes Relative: 5.1 % (ref 3.0–12.0)
Neutro Abs: 6.1 10*3/uL (ref 1.4–7.7)
Neutrophils Relative %: 61.4 % (ref 43.0–77.0)
Platelets: 394 10*3/uL (ref 150.0–400.0)
RBC: 5.01 Mil/uL (ref 3.87–5.11)
RDW: 12.9 % (ref 11.5–15.5)
WBC: 10 10*3/uL (ref 4.0–10.5)

## 2023-07-04 LAB — COMPREHENSIVE METABOLIC PANEL
ALT: 22 U/L (ref 0–35)
AST: 17 U/L (ref 0–37)
Albumin: 3.7 g/dL (ref 3.5–5.2)
Alkaline Phosphatase: 69 U/L (ref 39–117)
BUN: 10 mg/dL (ref 6–23)
CO2: 26 meq/L (ref 19–32)
Calcium: 8.7 mg/dL (ref 8.4–10.5)
Chloride: 99 meq/L (ref 96–112)
Creatinine, Ser: 0.83 mg/dL (ref 0.40–1.20)
GFR: 95.98 mL/min (ref >60.00)
Glucose, Bld: 288 mg/dL — ABNORMAL HIGH (ref 70–99)
Potassium: 4.1 meq/L (ref 3.5–5.1)
Sodium: 133 meq/L — ABNORMAL LOW (ref 135–145)
Total Bilirubin: 0.4 mg/dL (ref 0.2–1.2)
Total Protein: 6.8 g/dL (ref 6.0–8.3)

## 2023-07-14 ENCOUNTER — Encounter: Payer: Self-pay | Admitting: Physician Assistant

## 2023-07-14 NOTE — Assessment & Plan Note (Signed)
Uncontrolled, non-compliant lately with medications.  Start on Benicar hydrochlorothiazide 20-12.5 mg daily.  Pt aware of risks vs benefits and possible adverse reactions. Lifestyle changes. Monitor at home.

## 2023-07-14 NOTE — Assessment & Plan Note (Signed)
Lab Results  Component Value Date   HGBA1C 10.3 (A) 07/03/2023   HGBA1C 9.9 (H) 09/03/2022   Worsening, non-compliant the last 6 months due to grief. Plan to work with her to get her back on track.  Couldn't tolerate Metformin due to severe GI distress. Will try to get [facility] or Ozempic approved.  Work on lifestyle changes.

## 2023-07-15 NOTE — Telephone Encounter (Signed)
Please see patient response

## 2023-10-08 ENCOUNTER — Other Ambulatory Visit: Payer: Self-pay | Admitting: Physician Assistant

## 2023-12-16 ENCOUNTER — Encounter: Payer: Self-pay | Admitting: Physician Assistant

## 2023-12-27 ENCOUNTER — Observation Stay (HOSPITAL_BASED_OUTPATIENT_CLINIC_OR_DEPARTMENT_OTHER)
Admission: EM | Admit: 2023-12-27 | Discharge: 2023-12-28 | Disposition: A | Discharge status: Home / Self Care | Attending: Internal Medicine | Admitting: Internal Medicine

## 2023-12-27 ENCOUNTER — Encounter (HOSPITAL_BASED_OUTPATIENT_CLINIC_OR_DEPARTMENT_OTHER): Payer: Self-pay

## 2023-12-27 ENCOUNTER — Other Ambulatory Visit: Payer: Self-pay

## 2023-12-27 ENCOUNTER — Emergency Department (HOSPITAL_BASED_OUTPATIENT_CLINIC_OR_DEPARTMENT_OTHER)

## 2023-12-27 DIAGNOSIS — N12 Tubulo-interstitial nephritis, not specified as acute or chronic: Secondary | ICD-10-CM | POA: Diagnosis not present

## 2023-12-27 DIAGNOSIS — Z794 Long term (current) use of insulin: Secondary | ICD-10-CM | POA: Diagnosis not present

## 2023-12-27 DIAGNOSIS — E1165 Type 2 diabetes mellitus with hyperglycemia: Secondary | ICD-10-CM

## 2023-12-27 DIAGNOSIS — I10 Essential (primary) hypertension: Secondary | ICD-10-CM | POA: Diagnosis not present

## 2023-12-27 DIAGNOSIS — R6520 Severe sepsis without septic shock: Secondary | ICD-10-CM | POA: Diagnosis not present

## 2023-12-27 DIAGNOSIS — Z79899 Other long term (current) drug therapy: Secondary | ICD-10-CM | POA: Diagnosis not present

## 2023-12-27 DIAGNOSIS — E119 Type 2 diabetes mellitus without complications: Secondary | ICD-10-CM | POA: Insufficient documentation

## 2023-12-27 DIAGNOSIS — R110 Nausea: Secondary | ICD-10-CM | POA: Diagnosis present

## 2023-12-27 DIAGNOSIS — A419 Sepsis, unspecified organism: Secondary | ICD-10-CM | POA: Diagnosis not present

## 2023-12-27 LAB — PREGNANCY, URINE: Preg Test, Ur: NEGATIVE

## 2023-12-27 LAB — URINALYSIS, ROUTINE W REFLEX MICROSCOPIC
Bilirubin Urine: NEGATIVE
Glucose, UA: >1000 mg/dL — AB
Ketones, ur: NEGATIVE mg/dL
Nitrite: NEGATIVE
Protein, ur: >300 mg/dL — AB
RBC / HPF: >50 RBC/hpf (ref 0–5)
Specific Gravity, Urine: 1.03 (ref 1.005–1.030)
WBC, UA: >50 WBC/hpf (ref 0–5)
pH: 6 (ref 5.0–8.0)

## 2023-12-27 LAB — CBC WITH DIFFERENTIAL/PLATELET
Abs Immature Granulocytes: 0.13 10*3/uL — ABNORMAL HIGH (ref 0.00–0.07)
Basophils Absolute: 0.1 10*3/uL (ref 0.0–0.1)
Basophils Relative: 0 %
Eosinophils Absolute: 0 10*3/uL (ref 0.0–0.5)
Eosinophils Relative: 0 %
HCT: 49.6 % — ABNORMAL HIGH (ref 36.0–46.0)
Hemoglobin: 18 g/dL — ABNORMAL HIGH (ref 12.0–15.0)
Immature Granulocytes: 1 %
Lymphocytes Relative: 6 %
Lymphs Abs: 1.3 10*3/uL (ref 0.7–4.0)
MCH: 30.5 pg (ref 26.0–34.0)
MCHC: 36.3 g/dL — ABNORMAL HIGH (ref 30.0–36.0)
MCV: 83.9 fL (ref 80.0–100.0)
Monocytes Absolute: 0.7 10*3/uL (ref 0.1–1.0)
Monocytes Relative: 3 %
Neutro Abs: 19.2 10*3/uL — ABNORMAL HIGH (ref 1.7–7.7)
Neutrophils Relative %: 90 %
Platelets: 439 10*3/uL — ABNORMAL HIGH (ref 150–400)
RBC: 5.91 MIL/uL — ABNORMAL HIGH (ref 3.87–5.11)
RDW: 12 % (ref 11.5–15.5)
WBC: 21.4 10*3/uL — ABNORMAL HIGH (ref 4.0–10.5)
nRBC: 0 % (ref 0.0–0.2)

## 2023-12-27 LAB — COMPREHENSIVE METABOLIC PANEL WITH GFR
ALT: 16 U/L (ref 0–44)
AST: 9 U/L — ABNORMAL LOW (ref 15–41)
Albumin: 4.2 g/dL (ref 3.5–5.0)
Alkaline Phosphatase: 81 U/L (ref 38–126)
Anion gap: 14 (ref 5–15)
BUN: 14 mg/dL (ref 6–20)
CO2: 26 mmol/L (ref 22–32)
Calcium: 9.5 mg/dL (ref 8.9–10.3)
Chloride: 90 mmol/L — ABNORMAL LOW (ref 98–111)
Creatinine, Ser: 0.69 mg/dL (ref 0.44–1.00)
GFR, Estimated: >60 mL/min (ref >60)
Glucose, Bld: 310 mg/dL — ABNORMAL HIGH (ref 70–99)
Potassium: 3.4 mmol/L — ABNORMAL LOW (ref 3.5–5.1)
Sodium: 130 mmol/L — ABNORMAL LOW (ref 135–145)
Total Bilirubin: 1.2 mg/dL (ref 0.0–1.2)
Total Protein: 7.7 g/dL (ref 6.5–8.1)

## 2023-12-27 LAB — GLUCOSE, CAPILLARY
Glucose-Capillary: 273 mg/dL — ABNORMAL HIGH (ref 70–99)
Glucose-Capillary: 280 mg/dL — ABNORMAL HIGH (ref 70–99)
Glucose-Capillary: 210 mg/dL — ABNORMAL HIGH (ref 70–99)

## 2023-12-27 LAB — LACTIC ACID, PLASMA
Lactic Acid, Venous: 2.2 mmol/L (ref 0.5–1.9)
Lactic Acid, Venous: 2.3 mmol/L (ref 0.5–1.9)
Lactic Acid, Venous: 4.5 mmol/L (ref 0.5–1.9)
Lactic Acid, Venous: 3.7 mmol/L (ref 0.5–1.9)

## 2023-12-27 LAB — CBG MONITORING, ED: Glucose-Capillary: 287 mg/dL — ABNORMAL HIGH (ref 70–99)

## 2023-12-27 MED ORDER — TRAZODONE HCL 50 MG PO TABS
25.0000 mg | ORAL_TABLET | Freq: Every evening | ORAL | Status: DC | PRN
  Administered 2023-12-27: 25 mg via ORAL
  Filled 2023-12-27: qty 1

## 2023-12-27 MED ORDER — ONDANSETRON HCL 4 MG PO TABS: 4.0000 mg | ORAL_TABLET | Freq: Four times a day (QID) | ORAL | Status: DC | PRN

## 2023-12-27 MED ORDER — ACETAMINOPHEN 325 MG PO TABS: 650.0000 mg | ORAL_TABLET | Freq: Four times a day (QID) | ORAL | Status: DC | PRN

## 2023-12-27 MED ORDER — SODIUM CHLORIDE 0.9 % IV SOLN
1.0000 g | INTRAVENOUS | Status: DC
  Administered 2023-12-27 – 2023-12-28 (×2): 1 g via INTRAVENOUS
  Filled 2023-12-27 (×2): qty 10

## 2023-12-27 MED ORDER — ACETAMINOPHEN 650 MG RE SUPP: 650.0000 mg | Freq: Four times a day (QID) | RECTAL | Status: DC | PRN

## 2023-12-27 MED ORDER — HYDROCHLOROTHIAZIDE 12.5 MG PO TABS
12.5000 mg | ORAL_TABLET | Freq: Every day | ORAL | Status: DC
  Administered 2023-12-27 – 2023-12-28 (×2): 12.5 mg via ORAL
  Filled 2023-12-27 (×2): qty 1

## 2023-12-27 MED ORDER — SODIUM CHLORIDE 0.9 % IV SOLN: INTRAVENOUS | Status: DC

## 2023-12-27 MED ORDER — IOHEXOL 300 MG/ML  SOLN
100.0000 mL | Freq: Once | INTRAMUSCULAR | Status: AC | PRN
  Administered 2023-12-27: 100 mL via INTRAVENOUS

## 2023-12-27 MED ORDER — ONDANSETRON HCL 4 MG/2ML IJ SOLN
4.0000 mg | Freq: Four times a day (QID) | INTRAMUSCULAR | Status: DC | PRN
  Administered 2023-12-27: 4 mg via INTRAVENOUS
  Filled 2023-12-27: qty 2

## 2023-12-27 MED ORDER — INSULIN ASPART 100 UNIT/ML IJ SOLN
0.0000 [IU] | Freq: Every day | INTRAMUSCULAR | Status: DC
  Administered 2023-12-27: 2 [IU] via SUBCUTANEOUS

## 2023-12-27 MED ORDER — OLMESARTAN MEDOXOMIL-HCTZ 20-12.5 MG PO TABS: 1.0000 | ORAL_TABLET | Freq: Every day | ORAL | Status: DC

## 2023-12-27 MED ORDER — ENOXAPARIN SODIUM 60 MG/0.6ML IJ SOSY
50.0000 mg | PREFILLED_SYRINGE | INTRAMUSCULAR | Status: DC
  Administered 2023-12-27: 50 mg via SUBCUTANEOUS
  Filled 2023-12-27: qty 0.6

## 2023-12-27 MED ORDER — FLUTICASONE PROPIONATE 50 MCG/ACT NA SUSP
2.0000 | Freq: Every day | NASAL | Status: DC
  Administered 2023-12-27 – 2023-12-28 (×2): 2 via NASAL
  Filled 2023-12-27: qty 16

## 2023-12-27 MED ORDER — INSULIN ASPART 100 UNIT/ML IJ SOLN
0.0000 [IU] | Freq: Three times a day (TID) | INTRAMUSCULAR | Status: DC
  Administered 2023-12-27 (×2): 8 [IU] via SUBCUTANEOUS
  Administered 2023-12-28: 5 [IU] via SUBCUTANEOUS
  Administered 2023-12-28: 8 [IU] via SUBCUTANEOUS

## 2023-12-27 MED ORDER — SODIUM CHLORIDE 0.9 % IV BOLUS
1000.0000 mL | Freq: Once | INTRAVENOUS | Status: AC
  Administered 2023-12-27: 1000 mL via INTRAVENOUS

## 2023-12-27 MED ORDER — IRBESARTAN 150 MG PO TABS
150.0000 mg | ORAL_TABLET | Freq: Every day | ORAL | Status: DC
  Administered 2023-12-27 – 2023-12-28 (×2): 150 mg via ORAL
  Filled 2023-12-27 (×2): qty 1

## 2023-12-27 MED ORDER — SODIUM CHLORIDE 0.9 % IV BOLUS
2000.0000 mL | Freq: Once | INTRAVENOUS | Status: AC
  Administered 2023-12-27: 2000 mL via INTRAVENOUS

## 2023-12-27 MED ORDER — HYDRALAZINE HCL 20 MG/ML IJ SOLN
10.0000 mg | INTRAMUSCULAR | Status: DC | PRN
  Administered 2023-12-27 – 2023-12-28 (×4): 10 mg via INTRAVENOUS
  Filled 2023-12-27 (×4): qty 1

## 2023-12-27 MED ORDER — LABETALOL HCL 5 MG/ML IV SOLN
20.0000 mg | Freq: Once | INTRAVENOUS | Status: AC
  Administered 2023-12-27: 20 mg via INTRAVENOUS
  Filled 2023-12-27: qty 4

## 2023-12-27 NOTE — ED Notes (Signed)
 Called [facility] for transport, pt bed assignment is ready

## 2023-12-27 NOTE — ED Provider Notes (Signed)
 [place] EMERGENCY DEPARTMENT AT [facility] Provider Note   CSN: [id number] Arrival date & time: 12/27/23  0759     History  Chief Complaint  Patient presents with   Flank Pain    AMBRI MILTNER is a 28 y.o. female.  28 year old female presents with 2 days of bilateral flank pain as well as urinary frequency.  Thinks may have a kidney stone.  Also patient has a history of hypertension and has been noncompliant with her medications.  She denies any fever.  Has had nausea but no vomiting.  Denies any blood in her urine.  No treatment use prior to arrival       Home Medications Prior to Admission medications   Medication Sig Start Date End Date Taking? Authorizing Provider  albuterol (VENTOLIN HFA) 108 (90 Base) MCG/ACT inhaler Inhale 2 puffs into the lungs every 6 (six) hours as needed for wheezing or shortness of breath (Cough). 06/18/22   Theadora Rama Scales, PA-C  fluticasone (FLONASE) 50 MCG/ACT nasal spray Place 2 sprays into both nostrils daily. 10/08/22   Allwardt, Crist Infante, PA-C  olmesartan-hydrochlorothiazide (BENICAR HCT) 20-12.5 MG tablet Take 1 tablet by mouth daily. 07/03/23   Allwardt, Crist Infante, PA-C  tirzepatide [facility]) 2.5 MG/0.5ML Pen Inject 2.5 mg into the skin once a week. 07/03/23   Allwardt, Crist Infante, PA-C  valACYclovir (VALTREX) 500 MG tablet TAKE 1 TABLET (500 MG TOTAL) BY MOUTH DAILY. 10/08/23 01/06/24  Allwardt, Crist Infante, PA-C      Allergies    Metformin and related    Review of Systems   Review of Systems  All other systems reviewed and are negative.   Physical Exam Updated Vital Signs BP (!) 220/131 (BP Location: Left Arm)   Pulse (!) 130   Temp 97.9 F (36.6 C) (Oral)   Resp 18   LMP 11/22/2023 (Exact Date)   SpO2 97%  Physical Exam Vitals and nursing note reviewed.  Constitutional:      General: She is not in acute distress.    Appearance: Normal appearance. She is well-developed. She is not toxic-appearing.  HENT:      Head: Normocephalic and atraumatic.  Eyes:     General: Lids are normal.     Conjunctiva/sclera: Conjunctivae normal.     Pupils: Pupils are equal, round, and reactive to light.  Neck:     Thyroid: No thyroid mass.     Trachea: No tracheal deviation.  Cardiovascular:     Rate and Rhythm: Regular rhythm. Tachycardia present.     Heart sounds: Normal heart sounds. No murmur heard.    No gallop.  Pulmonary:     Effort: Pulmonary effort is normal. No respiratory distress.     Breath sounds: Normal breath sounds. No stridor. No decreased breath sounds, wheezing, rhonchi or rales.  Abdominal:     General: There is no distension.     Palpations: Abdomen is soft.     Tenderness: There is abdominal tenderness in the suprapubic area. There is no guarding or rebound.  Musculoskeletal:        General: No tenderness. Normal range of motion.     Cervical back: Normal range of motion and neck supple.  Skin:    General: Skin is warm and dry.     Findings: No abrasion or rash.  Neurological:     Mental Status: She is alert and oriented to person, place, and time. Mental status is at baseline.     GCS: GCS eye  subscore is 4. GCS verbal subscore is 5. GCS motor subscore is 6.     Cranial Nerves: No cranial nerve deficit.     Sensory: No sensory deficit.     Motor: Motor function is intact.  Psychiatric:        Attention and Perception: Attention normal.        Speech: Speech normal.        Behavior: Behavior normal.     ED Results / Procedures / Treatments   Labs (all labs ordered are listed, but only abnormal results are displayed) Labs Reviewed  URINALYSIS, ROUTINE W REFLEX MICROSCOPIC  PREGNANCY, URINE  CBC WITH DIFFERENTIAL/PLATELET  COMPREHENSIVE METABOLIC PANEL WITH GFR    EKG None  Radiology No results found.  Procedures Procedures    Medications Ordered in ED Medications  0.9 %  sodium chloride infusion (has no administration in time range)  sodium chloride 0.9 %  bolus 1,000 mL (has no administration in time range)  labetalol (NORMODYNE) injection 20 mg (has no administration in time range)    ED Course/ Medical Decision Making/ A&P                                 Medical Decision Making Amount and/or Complexity of Data Reviewed Labs: ordered. Radiology: ordered.  Risk Prescription drug management.   Patient presented with hypertension and tachycardia as well as bilateral flank pain.  Patient has been off her antihypertensives for quite some time.  Was treated with labetalol 20 mg IV push with good improvement of her symptoms.  Urinalysis consistent with infection as well as and hematuria.  Concern for possible infected stone and patient had abdominal CT which confirms pyelonephritis.  No evidence of renal stones.  Significant leukocytosis noted.  Patient had been started on IV Rocephin.  Plan will be to admit to the hospital for observation.  Will consult hospitalist team for admission  CRITICAL CARE Performed by: Toy Baker Total critical care time: 50 minutes Critical care time was exclusive of separately billable procedures and treating other patients. Critical care was necessary to treat or prevent imminent or life-threatening deterioration. Critical care was time spent personally by me on the following activities: development of treatment plan with patient and/or surrogate as well as nursing, discussions with consultants, evaluation of patient's response to treatment, examination of patient, obtaining history from patient or surrogate, ordering and performing treatments and interventions, ordering and review of laboratory studies, ordering and review of radiographic studies, pulse oximetry and re-evaluation of patient's condition.         Final Clinical Impression(s) / ED Diagnoses Final diagnoses:  None    Rx / DC Orders ED Discharge Orders     None         Lorre Nick, MD 12/27/23 [phone]

## 2023-12-27 NOTE — Plan of Care (Signed)
 ?  Problem: Education: ?Goal: Knowledge of General Education information will improve ?Description: Including pain rating scale, medication(s)/side effects and non-pharmacologic comfort measures ?Outcome: Progressing ?  ?Problem: Health Behavior/Discharge Planning: ?Goal: Ability to manage health-related needs will improve ?Outcome: Progressing ?  ?Problem: Coping: ?Goal: Level of anxiety will decrease ?Outcome: Progressing ?  ?

## 2023-12-27 NOTE — H&P (Signed)
 History and Physical  Debra Miles:[id number] DOB: 1996/09/07 DOA: 12/27/2023  PCP: Bary Leriche, PA-C   Chief Complaint: Nausea  HPI: Debra Miles is a 28 y.o. female with medical history significant for morbid obesity, poorly controlled diabetes and hypertension not on medication being admitted to the hospital with sepsis due to pyelonephritis.  She is not on diabetic medication due to being intolerant of metformin, says her insurance would not pay for injection that her PCP wanted her to be on.  She is prescribed blood pressure medication but does not take it.  In any case, starting about 2 to 3 days ago, she started feeling nauseous, having some mild chills.  Denies dysuria.  No nausea.  Presented to the ER today, where she was found to have leukocytosis, lactic acidosis, significant hypertension and evidence of pyelonephritis.  She was given empiric IV antibiotics and admitted to the hospitalist service.  Review of Systems: Please see HPI for pertinent positives and negatives. A complete 10 system review of systems are otherwise negative.  Past Medical History:  Diagnosis Date   Anxiety    Diabetes (HCC)    HSV-1 infection    HSV-2 (herpes simplex virus 2) infection    Hypertension    PCOS (polycystic ovarian syndrome)    Past Surgical History:  Procedure Laterality Date   WISDOM TOOTH EXTRACTION     Social History:  reports that she has never smoked. She has never used smokeless tobacco. She reports current alcohol use. She reports current drug use. Drug: Marijuana.  Allergies  Allergen Reactions   Metformin And Related Diarrhea and Nausea And Vomiting    Family History  Problem Relation Age of Onset   Hyperthyroidism Mother    Hypertension Father    Hyperthyroidism Brother    Cancer Maternal Grandmother    Diabetes Paternal Grandmother    Diabetes Paternal Grandfather    Heart disease Paternal Grandfather    Kidney disease Paternal Grandfather     Hypertension Paternal Grandfather    Diabetes Paternal Aunt    Breast cancer Paternal Aunt    Breast cancer Paternal Aunt        "mom" to her - passed in April 2024 after pneumonia     Prior to Admission medications   Medication Sig Start Date End Date Taking? Authorizing Provider  albuterol (VENTOLIN HFA) 108 (90 Base) MCG/ACT inhaler Inhale 2 puffs into the lungs every 6 (six) hours as needed for wheezing or shortness of breath (Cough). 06/18/22   Theadora Rama Scales, PA-C  fluticasone (FLONASE) 50 MCG/ACT nasal spray Place 2 sprays into both nostrils daily. 10/08/22   Allwardt, Crist Infante, PA-C  olmesartan-hydrochlorothiazide (BENICAR HCT) 20-12.5 MG tablet Take 1 tablet by mouth daily. 07/03/23   Allwardt, Crist Infante, PA-C  tirzepatide [facility]) 2.5 MG/0.5ML Pen Inject 2.5 mg into the skin once a week. 07/03/23   Allwardt, Crist Infante, PA-C  valACYclovir (VALTREX) 500 MG tablet TAKE 1 TABLET (500 MG TOTAL) BY MOUTH DAILY. 10/08/23 01/06/24  Allwardt, Crist Infante, PA-C    Physical Exam: BP (!) 206/98 (BP Location: Left Arm)   Pulse (!) 122   Temp 98.6 F (37 C) (Oral)   Resp 20   LMP 11/22/2023 (Exact Date)   SpO2 98%  General:  Alert, oriented, calm, in no acute distress, morbidly obese, evidence of hirsutism.  She looks comfortable and nontoxic. Cardiovascular: RRR, no murmurs or rubs, no peripheral edema  Respiratory: clear to auscultation bilaterally, no wheezes, no crackles  Abdomen: soft, nontender, nondistended, normal bowel tones heard  Skin: dry, no rashes  Musculoskeletal: no joint effusions, normal range of motion  Psychiatric: appropriate affect, normal speech  Neurologic: extraocular muscles intact, clear speech, moving all extremities with intact sensorium         Labs on Admission:  Basic Metabolic Panel: Recent Labs  Lab 12/27/23 0829  NA 130*  K 3.4*  CL 90*  CO2 26  GLUCOSE 310*  BUN 14  CREATININE 0.69  CALCIUM 9.5   Liver Function Tests: Recent Labs  Lab  12/27/23 0829  AST 9*  ALT 16  ALKPHOS 81  BILITOT 1.2  PROT 7.7  ALBUMIN 4.2   No results for input(s): "LIPASE", "AMYLASE" in the last 168 hours. No results for input(s): "AMMONIA" in the last 168 hours. CBC: Recent Labs  Lab 12/27/23 0829  WBC 21.4*  NEUTROABS 19.2*  HGB 18.0*  HCT 49.6*  MCV 83.9  PLT 439*   Cardiac Enzymes: No results for input(s): "CKTOTAL", "CKMB", "CKMBINDEX", "TROPONINI" in the last 168 hours. BNP (last 3 results) No results for input(s): "BNP" in the last 8760 hours.  ProBNP (last 3 results) No results for input(s): "PROBNP" in the last 8760 hours.  CBG: Recent Labs  Lab 12/27/23 0857  GLUCAP 287*    Radiological Exams on Admission: CT ABDOMEN PELVIS W CONTRAST Result Date: 12/27/2023 CLINICAL DATA:  Abdominal pain, acute, nonlocalized. Bilateral flank pain for 2 days. EXAM: CT ABDOMEN AND PELVIS WITH CONTRAST TECHNIQUE: Multidetector CT imaging of the abdomen and pelvis was performed using the standard protocol following bolus administration of intravenous contrast. RADIATION DOSE REDUCTION: This exam was performed according to the departmental dose-optimization program which includes automated exposure control, adjustment of the mA and/or kV according to patient size and/or use of iterative reconstruction technique. CONTRAST:  OMNIPAQUE IOHEXOL 300 MG/ML  SOLN COMPARISON:  Abdominopelvic CT 06/07/2023 FINDINGS: Lower chest: Clear lung bases. No significant pleural or pericardial effusion. Hepatobiliary: Previously demonstrated hepatic steatosis has mildly improved. There is an unchanged relatively Miles lesion in the left hepatic lobe measuring 1.5 cm, likely a flash filling hemangioma or other benign vascular finding. No suspicious liver lesions. Possible tiny dependent gallstones versus gallbladder sludge. No evidence of gallbladder wall thickening, surrounding inflammation or biliary ductal dilatation. Pancreas: Unremarkable. No pancreatic  ductal dilatation or surrounding inflammatory changes. Spleen: Normal in size without focal abnormality. Adrenals/Urinary Tract: Both adrenal glands appear normal. No evidence of urinary tract calculus, suspicious renal lesion or hydronephrosis. There is however mild symmetric ureteral wall thickening and mild periureteral soft tissue stranding bilaterally. The bladder appears unremarkable for its degree of distention. Stomach/Bowel: No enteric contrast administered. The stomach appears unremarkable for its degree of distension. No evidence of bowel wall thickening, distention or surrounding inflammatory change. The appendix appears normal. Vascular/Lymphatic: There are no enlarged abdominal or pelvic lymph nodes. No significant vascular findings. Reproductive: The uterus and ovaries appear normal. No adnexal mass. Other: Small amount of free pelvic fluid, within physiologic limits. No focal extraluminal fluid collection or pneumoperitoneum. Intact abdominal wall. Musculoskeletal: No acute or significant osseous findings. IMPRESSION: 1. Mild symmetric ureteral wall thickening and periureteral soft tissue stranding bilaterally, suspicious for ascending urinary tract infection. Correlate with urine analysis. No evidence of urinary tract calculus or hydronephrosis. 2. No other acute findings identified in the abdomen or pelvis. 3. Possible tiny dependent gallstones versus gallbladder sludge. No evidence of cholecystitis or biliary ductal dilatation. 4. Mild improvement in previously demonstrated hepatic steatosis. Stable  relatively Miles lesion in the left hepatic lobe, likely a hemangioma or other benign finding. Electronically Signed   By: Carey Bullocks M.D.   On: 12/27/2023 09:43   Assessment/Plan Debra Miles is a 28 y.o. female with medical history significant for morbid obesity, poorly controlled diabetes and hypertension not on medication being admitted to the hospital with sepsis due to pyelonephritis.     Severe sepsis due to pyelonephritis-meeting criteria with tachycardia, leukocytosis, endorgan dysfunction with lactic acidosis.  She is hemodynamically stable. -Observation admission -Empiric IV Rocephin -Blood or urine cultures were not obtained, will do so now -Received 1 L fluid bolus in the ER, will hold LR infusion for now due to hypertension -Will trend lactate and bolus as appropriate  Uncontrolled hypertension-asymptomatic, likely due to nausea and in the setting of medication noncompliance -Continue home olmesartan/hydrochlorothiazide -IV hydralazine as needed  Type 2 diabetes-poorly controlled, not on any home medication.  Last hemoglobin A1c 10.3 on 07/2023 -Update A1c -Carb modified diet -Moderate sliding scale insulin  DVT prophylaxis: Lovenox     Code Status: Full Code  Consults called: None  Admission status: Observation  Time spent: 55 minutes  Debra Crunk Sharlette Dense MD [facility] Pager [phone]  If 7PM-7AM, please contact night-coverage [email or web address] Password [facility]  12/27/2023, 12:24 PM

## 2023-12-27 NOTE — ED Notes (Signed)
 CRITICAL VALUE STICKER  CRITICAL VALUE:Lactate 2.2  RECEIVER (on-site recipient of call):Carmon Ginsberg, RN  DATE & TIME NOTIFIED:   MESSENGER (representative from lab):  MD NOTIFIED: Freida Busman  TIME OF NOTIFICATION:1031  RESPONSE:

## 2023-12-27 NOTE — Progress Notes (Signed)
 Pt arrived via [facility] with elevated blood pressure. Attending MD was notified of patients arrival. MD at bedside, put new orders in. This RN administered PRN hydralazine(see MAR) as well as insulin. Other orders awaiting approval from pharmacy. BP to be checked hourly for the next four hours.

## 2023-12-28 DIAGNOSIS — E119 Type 2 diabetes mellitus without complications: Secondary | ICD-10-CM

## 2023-12-28 DIAGNOSIS — I10 Essential (primary) hypertension: Secondary | ICD-10-CM

## 2023-12-28 DIAGNOSIS — Z794 Long term (current) use of insulin: Secondary | ICD-10-CM

## 2023-12-28 DIAGNOSIS — N12 Tubulo-interstitial nephritis, not specified as acute or chronic: Secondary | ICD-10-CM | POA: Diagnosis not present

## 2023-12-28 LAB — BASIC METABOLIC PANEL WITH GFR
Anion gap: 13 (ref 5–15)
BUN: 13 mg/dL (ref 6–20)
CO2: 23 mmol/L (ref 22–32)
Calcium: 8.6 mg/dL — ABNORMAL LOW (ref 8.9–10.3)
Chloride: 94 mmol/L — ABNORMAL LOW (ref 98–111)
Creatinine, Ser: 0.56 mg/dL (ref 0.44–1.00)
GFR, Estimated: >60 mL/min (ref >60)
Glucose, Bld: 220 mg/dL — ABNORMAL HIGH (ref 70–99)
Potassium: 3.3 mmol/L — ABNORMAL LOW (ref 3.5–5.1)
Sodium: 130 mmol/L — ABNORMAL LOW (ref 135–145)

## 2023-12-28 LAB — CBC
HCT: 46.5 % — ABNORMAL HIGH (ref 36.0–46.0)
Hemoglobin: 16 g/dL — ABNORMAL HIGH (ref 12.0–15.0)
MCH: 30.5 pg (ref 26.0–34.0)
MCHC: 34.4 g/dL (ref 30.0–36.0)
MCV: 88.7 fL (ref 80.0–100.0)
Platelets: 366 10*3/uL (ref 150–400)
RBC: 5.24 MIL/uL — ABNORMAL HIGH (ref 3.87–5.11)
RDW: 12.7 % (ref 11.5–15.5)
WBC: 19 10*3/uL — ABNORMAL HIGH (ref 4.0–10.5)
nRBC: 0 % (ref 0.0–0.2)

## 2023-12-28 LAB — GLUCOSE, CAPILLARY
Glucose-Capillary: 246 mg/dL — ABNORMAL HIGH (ref 70–99)
Glucose-Capillary: 257 mg/dL — ABNORMAL HIGH (ref 70–99)

## 2023-12-28 LAB — HIV ANTIBODY (ROUTINE TESTING W REFLEX): HIV Screen 4th Generation wRfx: NONREACTIVE

## 2023-12-28 MED ORDER — PEN NEEDLES 31G X 5 MM MISC
1.0000 | Freq: Three times a day (TID) | 11 refills | Status: DC
Start: 2023-12-28 — End: 2024-01-01

## 2023-12-28 MED ORDER — LANCET DEVICE MISC: 1.0000 | Freq: Three times a day (TID) | 0 refills | Status: DC

## 2023-12-28 MED ORDER — LANCETS MISC
1.0000 | Freq: Three times a day (TID) | 11 refills | Status: DC
Start: 2023-12-28 — End: 2024-01-06

## 2023-12-28 MED ORDER — CEFADROXIL 500 MG PO CAPS: 1000.0000 mg | ORAL_CAPSULE | Freq: Two times a day (BID) | ORAL | 0 refills | Status: AC

## 2023-12-28 MED ORDER — INSULIN ASPART PROT & ASPART (70-30 MIX) 100 UNIT/ML ~~LOC~~ SUSP: 15.0000 [IU] | Freq: Two times a day (BID) | SUBCUTANEOUS | 11 refills | Status: DC

## 2023-12-28 MED ORDER — BLOOD GLUCOSE TEST VI STRP: 1.0000 | ORAL_STRIP | Freq: Three times a day (TID) | 11 refills | Status: DC

## 2023-12-28 MED ORDER — INSULIN ASPART 100 UNIT/ML FLEXPEN: 0.0000 [IU] | PEN_INJECTOR | Freq: Three times a day (TID) | SUBCUTANEOUS | 11 refills | Status: DC

## 2023-12-28 MED ORDER — INSULIN ASPART PROT & ASPART (70-30 MIX) 100 UNIT/ML ~~LOC~~ SUSP
15.0000 [IU] | Freq: Two times a day (BID) | SUBCUTANEOUS | Status: DC
  Administered 2023-12-28: 15 [IU] via SUBCUTANEOUS
  Filled 2023-12-28: qty 10

## 2023-12-28 MED ORDER — BLOOD GLUCOSE MONITORING SUPPL DEVI
1.0000 | Freq: Three times a day (TID) | 0 refills | Status: DC
Start: 2023-12-28 — End: 2024-01-06

## 2023-12-28 NOTE — Discharge Summary (Signed)
 Physician Discharge Summary   Debra Miles ZOX:[id number] DOB: 05-09-1996 DOA: 12/27/2023  PCP: Bary Leriche, PA-C  Admit date: 12/27/2023 Discharge date:  12/28/2023  Admitted From: Home Disposition:  Home Discharging physician: Lewie Chamber, MD Barriers to discharge: none  Recommendations at discharge: Adjust insulin regimen as needed May need BP regimen adjustment   Discharge Condition: stable CODE STATUS: Full  Diet recommendation:  Diet Orders (From admission, onward)     Start     Ordered   12/28/23 0000  Diet Carb Modified        12/28/23 1058   12/27/23 1224  Diet Carb Modified Fluid consistency: Thin; Room service appropriate? Yes  Diet effective now       Question Answer Comment  Diet-HS Snack? Nothing   Calorie Level Medium 1600-2000   Fluid consistency: Thin   Room service appropriate? Yes      12/27/23 1224            Hospital Course: Ms. Loeffler is a 28 year old female with PMH DM II, HTN, obesity who presented with nausea, lower back pain, and dysuria. Urinalysis noted with large LE, negative nitrite, greater than 50 WBC.  CT abdomen/pelvis showed ureteral wall thickening and periureteral soft tissue stranding bilaterally concerning for ascending urinary tract infection. She was started on Rocephin and admitted for further workup.  Urine culture was also sent. She was transitioned to cefadroxil to complete course at discharge.  Discussion was also held regarding diabetes control as she was on no medications.  Last A1c was 10.3% in October 2024.  We discussed initiation of insulin and further management outpatient with primary care. Due to financial constraints, she was started on Humulin 70/30 and NovoLog sliding scale. She should continue routine A1c monitoring and any further regimen adjustments at follow-up.  She is compliant and up-to-date with blood pressure medication.   The patient's acute and chronic medical conditions were treated  accordingly. On day of discharge, patient was felt deemed stable for discharge. Patient/family member advised to call PCP or come back to ER if needed.   Principal Diagnosis: Pyelonephritis  Discharge Diagnoses: Active Hospital Problems   Diagnosis Date Noted   Pyelonephritis 12/27/2023    Priority: 1.   Essential hypertension 09/03/2022   Type 2 diabetes mellitus (HCC) 04/22/2012    Resolved Hospital Problems  No resolved problems to display.     Discharge Instructions     Diet Carb Modified   Complete by: As directed    Increase activity slowly   Complete by: As directed       Allergies as of 12/28/2023       Reactions   Metformin And Related Diarrhea, Nausea And Vomiting        Medication List     STOP taking these medications    tirzepatide 2.5 MG/0.5ML Pen Commonly known as: MOUNJARO       TAKE these medications    albuterol 108 (90 Base) MCG/ACT inhaler Commonly known as: VENTOLIN HFA Inhale 2 puffs into the lungs every 6 (six) hours as needed for wheezing or shortness of breath (Cough).   Blood Glucose Monitoring Suppl Devi 1 each by Does not apply route 3 (three) times daily. May dispense any manufacturer covered by patient's insurance.   BLOOD GLUCOSE TEST STRIPS Strp 1 each by Does not apply route 3 (three) times daily. Use as directed to check blood sugar. May dispense any manufacturer covered by patient's insurance and fits patient's device.   cefadroxil 500  MG capsule Commonly known as: DURICEF Take 2 capsules (1,000 mg total) by mouth 2 (two) times daily for 5 days. Start taking on: December 29, 2023   fluticasone 50 MCG/ACT nasal spray Commonly known as: FLONASE Place 2 sprays into both nostrils daily. What changed:  when to take this reasons to take this   insulin aspart 100 UNIT/ML FlexPen Commonly known as: NOVOLOG Inject 0-11 Units into the skin 4 (four) times daily -  before meals and at bedtime. Check Blood Glucose (BG) and inject  per scale: BG <150= 0 unit; BG 150-200= 1 unit; BG 201-250= 3 unit; BG 251-300= 5 unit; BG 301-350= 7 unit; BG 351-400= 9 unit; BG >400= 11 unit and Call Primary Care.   insulin aspart protamine- aspart (70-30) 100 UNIT/ML injection Commonly known as: NOVOLOG MIX 70/30 Inject 0.15 mLs (15 Units total) into the skin 2 (two) times daily with a meal.   Lancet Device Misc 1 each by Does not apply route 3 (three) times daily. May dispense any manufacturer covered by patient's insurance.   Lancets Misc 1 each by Does not apply route 3 (three) times daily. Use as directed to check blood sugar. May dispense any manufacturer covered by patient's insurance and fits patient's device.   olmesartan-hydrochlorothiazide 20-12.5 MG tablet Commonly known as: BENICAR HCT Take 1 tablet by mouth daily.   Pen Needles 31G X 5 MM Misc 1 each by Does not apply route 3 (three) times daily. May dispense any manufacturer covered by patient's insurance.   valACYclovir 500 MG tablet Commonly known as: VALTREX TAKE 1 TABLET (500 MG TOTAL) BY MOUTH DAILY.        Allergies  Allergen Reactions   Metformin And Related Diarrhea and Nausea And Vomiting    Consultations:   Procedures:   Discharge Exam: BP (!) 162/82 (BP Location: Left Arm) Comment: notify to the nurse.  Pulse (!) 109 Comment: notify to the nurse.  Temp 99 F (37.2 C) (Oral)   Resp 14   Ht 5\' 5"  (1.651 m)   Wt 106.1 kg   LMP 11/22/2023 (Exact Date)   SpO2 100%   BMI 38.92 kg/m  Physical Exam Constitutional:      Appearance: Normal appearance.  HENT:     Head: Normocephalic and atraumatic.     Mouth/Throat:     Mouth: Mucous membranes are moist.  Eyes:     Extraocular Movements: Extraocular movements intact.  Cardiovascular:     Rate and Rhythm: Normal rate and regular rhythm.  Pulmonary:     Effort: Pulmonary effort is normal. No respiratory distress.     Breath sounds: Normal breath sounds. No wheezing.  Abdominal:      General: Bowel sounds are normal. There is no distension.     Palpations: Abdomen is soft.     Tenderness: There is no abdominal tenderness.  Musculoskeletal:        General: Normal range of motion.     Cervical back: Normal range of motion and neck supple.  Skin:    General: Skin is warm and dry.  Neurological:     General: No focal deficit present.     Mental Status: She is alert.  Psychiatric:        Mood and Affect: Mood normal.      The results of significant diagnostics from this hospitalization (including imaging, microbiology, ancillary and laboratory) are listed below for reference.   Microbiology: Recent Results (from the past 240 hours)  Culture, blood (Routine  X 2) w Reflex to ID Panel     Status: None (Preliminary result)   Collection Time: 12/27/23  2:20 PM   Specimen: BLOOD LEFT ARM  Result Value Ref Range Status   Specimen Description   Final    BLOOD LEFT ARM Performed at [facility], [address], [place], [place] [address]    Special Requests   Final    BOTTLES DRAWN AEROBIC AND ANAEROBIC Blood Culture results may not be optimal due to an inadequate volume of blood received in culture bottles Performed at [facility], [address] [place], [place] [address]    Culture   Final    NO GROWTH < 12 HOURS Performed at [facility], [address] [place], [place] [address]    Report Status PENDING  Incomplete  Culture, blood (Routine X 2) w Reflex to ID Panel     Status: None (Preliminary result)   Collection Time: 12/27/23  2:27 PM   Specimen: BLOOD LEFT ARM  Result Value Ref Range Status   Specimen Description   Final    BLOOD LEFT ARM Performed at [facility], [address] [place], [place] [address]    Special Requests   Final    BOTTLES DRAWN AEROBIC AND ANAEROBIC Blood Culture results may not be optimal due to an inadequate volume of blood received in culture bottles Performed at [facility], [address] [place], [place] [address]    Culture   Final    NO GROWTH < 12 HOURS Performed at [facility], [address], [place], [place] [address]    Report Status PENDING  Incomplete     Labs: BNP (last 3 results) No results for input(s): "BNP" in the last 8760 hours. Basic Metabolic Panel: Recent Labs  Lab 12/27/23 0829 12/28/23 0521  NA 130* 130*  K 3.4* 3.3*  CL 90* 94*  CO2 26 23  GLUCOSE 310* 220*  BUN 14 13  CREATININE 0.69 0.56  CALCIUM 9.5 8.6*   Liver Function Tests: Recent Labs  Lab 12/27/23 0829  AST 9*  ALT 16  ALKPHOS 81  BILITOT 1.2  PROT 7.7  ALBUMIN 4.2   No results for input(s): "LIPASE", "AMYLASE" in the last 168 hours. No results for input(s): "AMMONIA" in the last 168 hours. CBC: Recent Labs  Lab 12/27/23 0829 12/28/23 0521  WBC 21.4* 19.0*  NEUTROABS 19.2*  --   HGB 18.0* 16.0*  HCT 49.6* 46.5*  MCV 83.9 88.7  PLT 439* 366   Cardiac Enzymes: No results for input(s): "CKTOTAL", "CKMB", "CKMBINDEX", "TROPONINI" in the last 168 hours. BNP: Invalid input(s): "POCBNP" CBG: Recent Labs  Lab 12/27/23 0857 12/27/23 1243 12/27/23 1701 12/27/23 2149 12/28/23 0814  GLUCAP 287* 273* 280* 210* 246*   D-Dimer No results for input(s): "DDIMER" in the last 72 hours. Hgb A1c No results for input(s): "HGBA1C" in the last 72 hours. Lipid Profile No results for input(s): "CHOL", "HDL", "LDLCALC", "TRIG", "CHOLHDL", "LDLDIRECT" in the last 72 hours. Thyroid function studies No results for input(s): "TSH", "T4TOTAL", "T3FREE", "THYROIDAB" in the last 72 hours.  Invalid input(s): "FREET3" Anemia work up No results for input(s): "VITAMINB12", "FOLATE", "FERRITIN", "TIBC", "IRON", "RETICCTPCT" in the last 72 hours. Urinalysis    Component Value Date/Time   COLORURINE YELLOW 12/27/2023 0811   APPEARANCEUR CLOUDY (A) 12/27/2023 0811   APPEARANCEUR Clear 10/12/2014 0033   LABSPEC 1.030 12/27/2023 0811   LABSPEC  1.026 10/12/2014 0033   PHURINE 6.0 12/27/2023 0811   GLUCOSEU >1,000 (A) 12/27/2023 0811   GLUCOSEU >=500 10/12/2014 0033   HGBUR LARGE (A) 12/27/2023 0811   BILIRUBINUR NEGATIVE 12/27/2023 0811   BILIRUBINUR negaitve 07/03/2023 1326   BILIRUBINUR Negative 10/12/2014 0033   KETONESUR NEGATIVE 12/27/2023 0811   PROTEINUR >300 (A) 12/27/2023 0811   UROBILINOGEN 0.2 07/03/2023 1326   UROBILINOGEN 0.2 11/20/2014 1820   NITRITE NEGATIVE 12/27/2023 0811   LEUKOCYTESUR LARGE (A) 12/27/2023 0811   LEUKOCYTESUR Negative 10/12/2014 0033   Sepsis Labs Recent Labs  Lab 12/27/23 0829 12/28/23 0521  WBC 21.4* 19.0*   Microbiology Recent Results (from the past 240 hours)  Culture, blood (Routine X 2) w Reflex to ID Panel     Status: None (Preliminary result)   Collection Time: 12/27/23  2:20 PM   Specimen: BLOOD LEFT ARM  Result Value Ref Range Status   Specimen Description   Final    BLOOD LEFT ARM Performed at [facility], [address] [place], [place] [address]    Special Requests   Final    BOTTLES DRAWN AEROBIC AND ANAEROBIC Blood Culture results may not be optimal due to an inadequate volume of blood received in culture bottles Performed at [facility], [address] [place], [place] [address]    Culture   Final    NO GROWTH < 12 HOURS Performed at [facility], [address], [place], [place] [address]    Report Status PENDING  Incomplete  Culture, blood (Routine X 2) w Reflex to ID Panel     Status: None (Preliminary result)   Collection Time: 12/27/23  2:27 PM   Specimen: BLOOD LEFT ARM  Result Value Ref Range Status   Specimen Description   Final    BLOOD LEFT ARM Performed at [facility], [address], [place], [place] [address]    Special Requests   Final    BOTTLES DRAWN AEROBIC AND ANAEROBIC Blood Culture results may not be optimal due to an inadequate volume of blood received in culture bottles Performed at [facility], [address] [place], [place] [address]    Culture   Final    NO GROWTH < 12 HOURS Performed at [facility], [address], [place], [place] [address]    Report Status PENDING  Incomplete    Procedures/Studies: CT ABDOMEN PELVIS W CONTRAST Result Date: 12/27/2023 CLINICAL DATA:  Abdominal pain, acute, nonlocalized. Bilateral flank pain for 2 days. EXAM: CT ABDOMEN AND PELVIS WITH CONTRAST TECHNIQUE: Multidetector CT imaging of the abdomen and pelvis was performed using the standard protocol following bolus administration of intravenous contrast. RADIATION DOSE REDUCTION: This exam was performed according to the departmental dose-optimization program which includes automated exposure control, adjustment of the mA and/or kV according to patient size and/or use of iterative reconstruction technique. CONTRAST:  OMNIPAQUE IOHEXOL 300 MG/ML  SOLN COMPARISON:  Abdominopelvic CT 06/07/2023 FINDINGS: Lower chest: Clear lung bases. No significant pleural or pericardial effusion. Hepatobiliary: Previously demonstrated hepatic steatosis has mildly improved. There is an unchanged relatively dense lesion in the left hepatic lobe measuring 1.5 cm, likely a flash filling hemangioma or other benign vascular finding. No suspicious liver lesions. Possible tiny dependent gallstones versus gallbladder sludge. No evidence of gallbladder wall thickening, surrounding inflammation or biliary ductal dilatation. Pancreas: Unremarkable. No pancreatic ductal dilatation or surrounding inflammatory changes. Spleen: Normal in size without focal abnormality.  Adrenals/Urinary Tract: Both adrenal glands appear normal. No evidence of urinary tract calculus, suspicious renal lesion or hydronephrosis. There is however mild symmetric ureteral wall thickening and mild periureteral soft tissue stranding bilaterally. The bladder appears unremarkable for its degree of distention. Stomach/Bowel: No enteric  contrast administered. The stomach appears unremarkable for its degree of distension. No evidence of bowel wall thickening, distention or surrounding inflammatory change. The appendix appears normal. Vascular/Lymphatic: There are no enlarged abdominal or pelvic lymph nodes. No significant vascular findings. Reproductive: The uterus and ovaries appear normal. No adnexal mass. Other: Small amount of free pelvic fluid, within physiologic limits. No focal extraluminal fluid collection or pneumoperitoneum. Intact abdominal wall. Musculoskeletal: No acute or significant osseous findings. IMPRESSION: 1. Mild symmetric ureteral wall thickening and periureteral soft tissue stranding bilaterally, suspicious for ascending urinary tract infection. Correlate with urine analysis. No evidence of urinary tract calculus or hydronephrosis. 2. No other acute findings identified in the abdomen or pelvis. 3. Possible tiny dependent gallstones versus gallbladder sludge. No evidence of cholecystitis or biliary ductal dilatation. 4. Mild improvement in previously demonstrated hepatic steatosis. Stable relatively dense lesion in the left hepatic lobe, likely a hemangioma or other benign finding. Electronically Signed   By: Carey Bullocks M.D.   On: 12/27/2023 09:43     Time coordinating discharge: Over 30 minutes    Lewie Chamber, MD  [facility] 12/28/2023, 11:35 AM

## 2023-12-28 NOTE — Plan of Care (Signed)
 ?  Problem: Clinical Measurements: ?Goal: Will remain free from infection ?Outcome: Progressing ?  ?

## 2023-12-28 NOTE — TOC Initial Note (Signed)
 Transition of Care [facility]) - Initial/Assessment Note    Patient Details  Name: Debra Miles MRN: [id number] Date of Birth: June 25, 1996  Transition of [facility]) CM/SW Contact:    Debra Prows, RN Phone Number: 12/28/2023, 12:28 PM  Clinical Narrative:                 Debra Miles w/ pt and sister in room; pt says she lives at home; she plans to return at d/c; pt identified POC Debra Miles (brother [phone]); pt says her sister will provide transportation; pt verfied insurance/ PCP she denies SDOH risks; pt says she does not have DME, HH services, or home oxygen; no TOC needs.  Expected Discharge Plan: Home/Self Care Barriers to Discharge: No Barriers Identified   Patient Goals and CMS Choice Patient states their goals for this hospitalization and ongoing recovery are:: home          Expected Discharge Plan and Services   Discharge Planning Services: CM Consult Post Acute Care Choice: NA   Expected Discharge Date: 12/28/23               DME Arranged: N/A DME Agency: NA       HH Arranged: NA HH Agency: NA        Prior Living Arrangements/Services   Lives with:: Relatives Patient language and need for interpreter reviewed:: Yes Do you feel safe going back to the place where you live?: Yes      Need for Family Participation in Patient Care: Yes (Comment) Care giver support system in place?: Yes (comment) Current home services:  (n/a) Criminal Activity/Legal Involvement Pertinent to Current Situation/Hospitalization: No - Comment as needed  Activities of Daily Living   ADL Screening (condition at time of admission) Independently performs ADLs?: Yes (appropriate for developmental age) Is the patient deaf or have difficulty hearing?: No Does the patient have difficulty seeing, even when wearing glasses/contacts?: No Does the patient have difficulty concentrating, remembering, or making decisions?: No  Permission Sought/Granted Permission sought to  share information with : Case Manager Permission granted to share information with : Yes, Verbal Permission Granted  Share Information with NAME: Case Manager     Permission granted to share info w Relationship: Debra Miles (brother) [phone]     Emotional Assessment Appearance:: Appears stated age Attitude/Demeanor/Rapport: Gracious Affect (typically observed): Accepting Orientation: : Oriented to Self, Oriented to Place, Oriented to  Time, Oriented to Situation Alcohol / Substance Use: Not Applicable Psych Involvement: No (comment)  Admission diagnosis:  Pyelonephritis [N12] Patient Active Problem List   Diagnosis Date Noted   Pyelonephritis 12/27/2023   Fatty liver 07/03/2023   Gallstones 07/03/2023   Hypertensive disorder 10/08/2022   Polycystic ovaries 10/08/2022   Family history of breast cancer in female 09/19/2022   Bacterial vaginosis 09/19/2022   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 [place] [address] Phone: [phone] Fax: [phone]  [id number] - [address] ([place]), [place] - [address] [place] [address] Phone: [phone] Fax: [phone]     Social Drivers of Health (  SDOH) Social History: SDOH Screenings   Food Insecurity: No Food Insecurity (12/28/2023)  Housing: Low Risk  (12/28/2023)  Transportation Needs: No Transportation Needs (12/28/2023)  Utilities: Not At Risk (12/28/2023)  Alcohol Screen: Low Risk  (07/03/2023)  Depression (PHQ2-9): High Risk  (07/03/2023)  Financial Resource Strain: Low Risk  (07/03/2023)  Physical Activity: Insufficiently Active (07/03/2023)  Social Connections: Socially Isolated (12/27/2023)  Stress: Stress Concern Present (07/03/2023)  Tobacco Use: Low Risk  (12/27/2023)   SDOH Interventions: Food Insecurity Interventions: Intervention Not Indicated, Inpatient TOC Housing Interventions: Intervention Not Indicated, Inpatient TOC Transportation Interventions: Intervention Not Indicated, Inpatient TOC Utilities Interventions: Intervention Not Indicated, Inpatient TOC   Readmission Risk Interventions     No data to display

## 2023-12-28 NOTE — Progress Notes (Signed)
   12/28/23 0148  Assess: MEWS Score  Temp 98.4 F (36.9 C)  BP (!) 179/96  MAP (mmHg) 114  Pulse Rate (!) 115  Resp 16  Level of Consciousness Alert  SpO2 98 %  O2 Device Room Air  Assess: MEWS Score  MEWS Temp 0  MEWS Systolic 0  MEWS Pulse 2  MEWS RR 0  MEWS LOC 0  MEWS Score 2  MEWS Score Color Yellow  Assess: if the MEWS score is Yellow or Red  Were vital signs accurate and taken at a resting state? Yes  Does the patient meet 2 or more of the SIRS criteria? No  MEWS guidelines implemented  Yes, yellow  Treat  MEWS Interventions Considered administering scheduled or prn medications/treatments as ordered  Take Vital Signs  Increase Vital Sign Frequency  Yellow: Q2hr x1, continue Q4hrs until patient remains green for 12hrs  Escalate  MEWS: Escalate Yellow: Discuss with charge nurse and consider notifying provider and/or RRT  Notify: Charge Nurse/RN  Name of Charge Nurse/RN Notified Tou Hayner RN  Provider Notification  Provider Name/Title Chinita Greenland NP  Date Provider Notified 12/28/23  Time Provider Notified 0231  Method of Notification Page  Notification Reason Other (Comment) (yellow MEWS)  Provider response No new orders  Date of Provider Response 12/28/23  Time of Provider Response 0231  Assess: SIRS CRITERIA  SIRS Temperature  0  SIRS Respirations  0  SIRS Pulse 1  SIRS WBC 0  SIRS Score Sum  1

## 2023-12-28 NOTE — Progress Notes (Signed)
 Patient was given discharge instructions and instructions for insulin pen. Patient watched the video on how to use the insulin pen. All questions were answered. Patient was stable for discharge and will follow up with her PCP. She was walked to the main exit.

## 2023-12-28 NOTE — Hospital Course (Signed)
 Ms. Bebee is a 28 year old female with PMH DM II, HTN, obesity who presented with nausea, lower back pain, and dysuria. Urinalysis noted with large LE, negative nitrite, greater than 50 WBC.  CT abdomen/pelvis showed ureteral wall thickening and periureteral soft tissue stranding bilaterally concerning for ascending urinary tract infection. She was started on Rocephin and admitted for further workup.  Urine culture was also sent. She was transitioned to cefadroxil to complete course at discharge.  Discussion was also held regarding diabetes control as she was on no medications.  Last A1c was 10.3% in October 2024.  We discussed initiation of insulin and further management outpatient with primary care. Due to financial constraints, she was started on Humulin 70/30 and NovoLog sliding scale. She should continue routine A1c monitoring and any further regimen adjustments at follow-up.  She is compliant and up-to-date with blood pressure medication.

## 2023-12-29 LAB — URINE CULTURE

## 2024-01-01 ENCOUNTER — Encounter: Payer: Self-pay | Admitting: Physician Assistant

## 2024-01-01 ENCOUNTER — Other Ambulatory Visit: Payer: Self-pay | Admitting: Physician Assistant

## 2024-01-01 ENCOUNTER — Ambulatory Visit (INDEPENDENT_AMBULATORY_CARE_PROVIDER_SITE_OTHER): Admitting: Physician Assistant

## 2024-01-01 VITALS — BP 176/120 | HR 97 | Temp 97.9°F | Ht 65.0 in | Wt 234.8 lb

## 2024-01-01 DIAGNOSIS — Z794 Long term (current) use of insulin: Secondary | ICD-10-CM | POA: Diagnosis not present

## 2024-01-01 DIAGNOSIS — F1090 Alcohol use, unspecified, uncomplicated: Secondary | ICD-10-CM

## 2024-01-01 DIAGNOSIS — E1165 Type 2 diabetes mellitus with hyperglycemia: Secondary | ICD-10-CM

## 2024-01-01 DIAGNOSIS — N926 Irregular menstruation, unspecified: Secondary | ICD-10-CM | POA: Insufficient documentation

## 2024-01-01 DIAGNOSIS — N939 Abnormal uterine and vaginal bleeding, unspecified: Secondary | ICD-10-CM | POA: Insufficient documentation

## 2024-01-01 DIAGNOSIS — N12 Tubulo-interstitial nephritis, not specified as acute or chronic: Secondary | ICD-10-CM

## 2024-01-01 DIAGNOSIS — I10 Essential (primary) hypertension: Secondary | ICD-10-CM | POA: Diagnosis not present

## 2024-01-01 DIAGNOSIS — N898 Other specified noninflammatory disorders of vagina: Secondary | ICD-10-CM | POA: Insufficient documentation

## 2024-01-01 LAB — CULTURE, BLOOD (ROUTINE X 2)

## 2024-01-01 LAB — MICROALBUMIN / CREATININE URINE RATIO
Creatinine,U: 22.1 mg/dL
Microalb Creat Ratio: 98.6 mg/g — ABNORMAL HIGH (ref 0.0–30.0)
Microalb, Ur: 2.2 mg/dL — ABNORMAL HIGH (ref 0.0–1.9)

## 2024-01-01 LAB — POC URINALSYSI DIPSTICK (AUTOMATED)
Bilirubin, UA: NEGATIVE
Blood, UA: POSITIVE
Glucose, UA: POSITIVE — AB
Ketones, UA: NEGATIVE
Leukocytes, UA: NEGATIVE
Nitrite, UA: NEGATIVE
Protein, UA: NEGATIVE
Spec Grav, UA: 1.01 (ref 1.010–1.025)
Urobilinogen, UA: 0.2 U/dL
pH, UA: 6 (ref 5.0–8.0)

## 2024-01-01 LAB — POCT GLYCOSYLATED HEMOGLOBIN (HGB A1C): Hemoglobin A1C: 10 % — AB (ref 4.0–5.6)

## 2024-01-01 MED ORDER — FREESTYLE LIBRE 3 PLUS SENSOR MISC
11 refills | Status: AC
Start: 2024-01-01 — End: ?

## 2024-01-01 MED ORDER — PEN NEEDLES 31G X 5 MM MISC: 1.0000 | Freq: Three times a day (TID) | 11 refills | Status: AC

## 2024-01-01 MED ORDER — INSULIN ASPART PROT & ASPART (70-30 MIX) 100 UNIT/ML ~~LOC~~ SUSP: 15.0000 [IU] | Freq: Two times a day (BID) | SUBCUTANEOUS | 11 refills | Status: DC

## 2024-01-01 MED ORDER — OLMESARTAN MEDOXOMIL-HCTZ 20-12.5 MG PO TABS: 1.0000 | ORAL_TABLET | Freq: Every day | ORAL | 1 refills | Status: DC

## 2024-01-01 MED ORDER — INSULIN ASPART 100 UNIT/ML FLEXPEN: 0.0000 [IU] | PEN_INJECTOR | Freq: Three times a day (TID) | SUBCUTANEOUS | 11 refills | Status: DC

## 2024-01-01 MED ORDER — FLUCONAZOLE 150 MG PO TABS: 150.0000 mg | ORAL_TABLET | Freq: Once | ORAL | 0 refills | Status: AC

## 2024-01-01 NOTE — Telephone Encounter (Signed)
 Please see pharmacy comments and advise alternative

## 2024-01-01 NOTE — Progress Notes (Addendum)
 Chief Complaint:  Debra Miles is a 28 y.o. female who presents today for a TCM visit.   Subjective:  HPI:  Discussed the use of AI scribe software for clinical note transcription with the patient, who gave verbal consent to proceed.  History of Present Illness Debra Miles is a 28 year old female who presents for a follow-up after hospitalization for pyelonephritis.  She was hospitalized from March 29 to December 28, 2023, for pyelonephritis, during which she experienced significantly elevated blood pressure and high blood glucose levels. Her blood pressure was described as 'really, really high' by patient and her blood sugar was also elevated. She was discharged with a prescription for several medications.  Prior to hospitalization, she had symptoms of a urinary tract infection, including back and pelvic pain, and a fluctuating fever. She initially attempted self-treatment with cranberry juice but sought medical attention when symptoms worsened. During her hospital stay, she was treated with antibiotics, initially receiving Rocephin and then transitioning to cefadroxil upon discharge. She is feeling better now but is concerned about a potential yeast infection following antibiotic treatment.  She has been prescribed blood glucose supplies, Novolog (fast-acting insulin), and a long-acting insulin. She was instructed to use the 70/30 insulin twice daily, at breakfast and dinner, and the long-acting insulin if her blood sugar exceeds a certain level. She has not been compliant with her medication regimen due to insurance issues and confusion regarding her coverage. She is attempting to resolve these issues to ensure she can access her medications. Her A1c is 10.0%, consistent with previous levels. She is attempting to follow a carb-modified diet since her hospital discharge, although she admits to occasional lapses, such as eating at a fast-food restaurant due to convenience. She wants to  improve her dietary habits and has been making efforts to cook more at home.  She has been prescribed blood pressure medication, specifically Benicar 20/12.5 mg, which she started taking after her hospital discharge. She admits to not taking her medication consistently, including missing a dose this morning. She attributes some of her elevated blood pressure to her alcohol consumption, stating she has been drinking heavily for a couple of months, primarily consuming [employer] vodka and occasionally beer. She acknowledges that alcohol consumption may be affecting her blood pressure and diabetes management.  In terms of social history, she reports heavy alcohol consumption over the past few months, primarily drinking [employer] vodka and occasionally beer. She also mentions smoking marijuana, although less frequently when she drinks. She is aware of the impact of her lifestyle choices on her health and is motivated to make changes.  Family history is notable for her grandmother and aunt having had strokes, which she finds concerning given her own health issues.  No chest pain, shortness of breath, dizziness, headaches, or blurred vision. History of fluctuating fever associated with her recent UTI.   ROS: Negative review of systems at this time PMH:  The following were reviewed and entered/updated in epic: Past Medical History:  Diagnosis Date   Anxiety    Diabetes (HCC)    HSV-1 infection    HSV-2 (herpes simplex virus 2) infection    Hypertension    PCOS (polycystic ovarian syndrome)    Patient Active Problem List   Diagnosis Date Noted   Abnormal uterine bleeding 01/01/2024   Irregular periods 01/01/2024   Vaginal irritation 01/01/2024   Pyelonephritis 12/27/2023   Fatty liver 07/03/2023   Gallstones 07/03/2023   Hypertensive disorder 10/08/2022  Polycystic ovaries 10/08/2022   Family history of breast cancer in female 09/19/2022   Bacterial vaginosis 09/19/2022   HSV (herpes simplex  virus) infection 09/03/2022   Type 2 diabetes mellitus with hyperglycemia, without long-term current use of insulin (HCC) 09/03/2022   PCOS (polycystic ovarian syndrome) 09/03/2022   Essential hypertension 09/03/2022   Type 2 diabetes mellitus (HCC) 04/22/2012   Polycystic ovarian syndrome 04/20/2012   Past Surgical History:  Procedure Laterality Date   WISDOM TOOTH EXTRACTION      Family History  Problem Relation Age of Onset   Hyperthyroidism Mother    Hypertension Father    Hyperthyroidism Brother    Cancer Maternal Grandmother    Diabetes Paternal Grandmother    Diabetes Paternal Grandfather    Heart disease Paternal Grandfather    Kidney disease Paternal Grandfather    Hypertension Paternal Grandfather    Diabetes Paternal Aunt    Breast cancer Paternal Aunt    Breast cancer Paternal Aunt        "mom" to her - passed in April 2024 after pneumonia    Medications- Reconciled discharge and current medications in Epic.  Current Outpatient Medications  Medication Sig Dispense Refill   albuterol (VENTOLIN HFA) 108 (90 Base) MCG/ACT inhaler Inhale 2 puffs into the lungs every 6 (six) hours as needed for wheezing or shortness of breath (Cough). 18 g 0   cefadroxil (DURICEF) 500 MG capsule Take 2 capsules (1,000 mg total) by mouth 2 (two) times daily for 5 days. 20 capsule 0   fluticasone (FLONASE) 50 MCG/ACT nasal spray Place 2 sprays into both nostrils daily. (Patient taking differently: Place 2 sprays into both nostrils daily as needed for allergies or rhinitis.) 16 g 3   olmesartan-hydrochlorothiazide (BENICAR HCT) 20-12.5 MG tablet Take 1 tablet by mouth daily. 30 tablet 2   Blood Glucose Monitoring Suppl DEVI 1 each by Does not apply route 3 (three) times daily. May dispense any manufacturer covered by patient's insurance. (Patient not taking: Reported on 01/01/2024) 1 each 0   Glucose Blood (BLOOD GLUCOSE TEST STRIPS) STRP 1 each by Does not apply route 3 (three) times daily.  Use as directed to check blood sugar. May dispense any manufacturer covered by patient's insurance and fits patient's device. (Patient not taking: Reported on 01/01/2024) 200 strip 11   insulin aspart (NOVOLOG) 100 UNIT/ML FlexPen Inject 0-11 Units into the skin 4 (four) times daily -  before meals and at bedtime. Check Blood Glucose (BG) and inject per scale: BG <150= 0 unit; BG 150-200= 1 unit; BG 201-250= 3 unit; BG 251-300= 5 unit; BG 301-350= 7 unit; BG 351-400= 9 unit; BG >400= 11 unit and Call Primary Care. (Patient not taking: Reported on 01/01/2024) 15 mL 11   insulin aspart protamine- aspart (NOVOLOG MIX 70/30) (70-30) 100 UNIT/ML injection Inject 0.15 mLs (15 Units total) into the skin 2 (two) times daily with a meal. (Patient not taking: Reported on 01/01/2024) 10 mL 11   Insulin Pen Needle (PEN NEEDLES) 31G X 5 MM MISC 1 each by Does not apply route 3 (three) times daily. May dispense any manufacturer covered by patient's insurance. (Patient not taking: Reported on 01/01/2024) 100 each 11   Lancet Device MISC 1 each by Does not apply route 3 (three) times daily. May dispense any manufacturer covered by patient's insurance. (Patient not taking: Reported on 01/01/2024) 1 each 0   Lancets MISC 1 each by Does not apply route 3 (three) times daily. Use as directed  to check blood sugar. May dispense any manufacturer covered by patient's insurance and fits patient's device. (Patient not taking: Reported on 01/01/2024) 200 each 11   valACYclovir (VALTREX) 500 MG tablet TAKE 1 TABLET (500 MG TOTAL) BY MOUTH DAILY. (Patient not taking: Reported on 01/01/2024) 30 tablet 2   No current facility-administered medications for this visit.    Allergies-reviewed and updated Allergies  Allergen Reactions   Metformin And Related Diarrhea and Nausea And Vomiting    Social History   Socioeconomic History   Marital status: Single    Spouse name: Not on file   Number of children: Not on file   Years of education: Not  on file   Highest education level: 12th grade  Occupational History   Not on file  Tobacco Use   Smoking status: Never   Smokeless tobacco: Never  Vaping Use   Vaping status: Some Days   Substances: Nicotine, Flavoring  Substance and Sexual Activity   Alcohol use: Yes    Comment: occasionally   Drug use: Yes    Types: Marijuana   Sexual activity: Yes    Birth control/protection: None  Other Topics Concern   Not on file  Social History Narrative   Not on file   Social Drivers of Health   Financial Resource Strain: Low Risk  (07/03/2023)   Overall Financial Resource Strain (CARDIA)    Difficulty of Paying Living Expenses: Not hard at all  Food Insecurity: No Food Insecurity (12/28/2023)   Hunger Vital Sign    Worried About Running Out of Food in the Last Year: Never true    Ran Out of Food in the Last Year: Never true  Transportation Needs: No Transportation Needs (12/28/2023)   PRAPARE - Administrator, Civil Service (Medical): No    Lack of Transportation (Non-Medical): No  Physical Activity: Insufficiently Active (07/03/2023)   Exercise Vital Sign    Days of Exercise per Week: 5 days    Minutes of Exercise per Session: 10 min  Stress: Stress Concern Present (07/03/2023)   [employer] of Occupational Health - Occupational Stress Questionnaire    Feeling of Stress : To some extent  Social Connections: Socially Isolated (12/27/2023)   Social Connection and Isolation Panel [NHANES]    Frequency of Communication with Friends and Family: More than three times a week    Frequency of Social Gatherings with Friends and Family: Once a week    Attends Religious Services: Never    Database administrator or Organizations: No    Attends Engineer, structural: Not on file    Marital Status: Never married        Objective:  Physical Exam: BP (!) 176/120 (BP Location: Left Arm, Patient Position: Sitting, Cuff Size: Normal)   Pulse 97   Temp 97.9 F (36.6  C) (Temporal)   Ht 5\' 5"  (1.651 m)   Wt 234 lb 12.8 oz (106.5 kg)   LMP 11/22/2023 (Exact Date)   SpO2 98%   BMI 39.07 kg/m   Gen: NAD, resting comfortably CV: RRR with no murmurs appreciated Pulm: NWOB, CTAB with no crackles, wheezes, or rhonchi MSK: No edema, cyanosis, or clubbing noted Skin: Warm, dry Neuro: Grossly normal, moves all extremities Psych: Normal affect and thought content  Assessment/Plan:  Assessment and Plan Assessment & Plan Pyelonephritis Recent hospitalization for pyelonephritis with hypertension and hyperglycemia. CT abdomen and pelvis indicated ascending UTI with ureteral wall thickening and soft tissue stranding. Treated with antibiotics  and discharged on cefadroxil. Currently improved, but urine culture showed multiple species, necessitating recollection. - Recollect urine culture to ensure resolution of infection - Prescribe fluconazole for yeast infection, with instructions to take one tablet and repeat dose in 72 hours if still symptomatic  Hypertension Severely elevated blood pressure during recent hospitalization and today (176/120 mmHg). Non-compliance with medication and lifestyle factors, including alcohol consumption, contributing to poor control. She is aware of the risks, including stroke, and is motivated to improve adherence to medication and lifestyle changes. - Instruct to take Benicar 20-12.5 mg upon returning home - Schedule follow-up appointment in 3-4 weeks to monitor blood pressure  Diabetes Mellitus Poorly controlled diabetes with recent A1c of 10.0%. Non-compliance with medication and dietary recommendations. Confusion regarding insurance coverage for prescribed medications, including insulin. Motivated to improve management and has started a carb-modified diet. Discussed potential use of Ozempic in the future, but current focus is on stabilizing blood glucose levels and addressing insurance issues. - Resend prescriptions to preferred  pharmacy ([facility]) - Attempt to resolve insurance issues to ensure coverage of medications - Provide a sample of Freestyle Libre for glucose monitoring and attempt to get it approved through Medicaid - Patient was educated on the use of this product in-office today, the benefits of monitoring glucose continuously to avoid hypo- and hyper-glycemic severe situations that could lead to hospitalization. - Schedule follow-up appointment in 3-4 weeks to monitor diabetes management  Alcohol Use Reports heavy alcohol consumption over the past few months, contributing to poor blood pressure and diabetes control. Acknowledges the need to reduce alcohol intake and is working on it, but declines additional support or resources at this time. - Encourage reduction of alcohol intake and provide support as needed  General Health Maintenance Motivated to improve overall health through lifestyle modifications, including dietary changes and reducing alcohol consumption. Has started cooking at home and following a carb-modified diet. - Encourage continuation of healthy lifestyle changes, including carb-modified diet and reduced alcohol consumption   Time Spent: 45 minutes of total time was spent on the date of the encounter performing the following actions: chart review prior to seeing the patient, obtaining history, performing a medically necessary exam, counseling on the treatment plan, placing orders, and documenting in our EHR.     Roshaunda Starkey, PA-C

## 2024-01-02 ENCOUNTER — Other Ambulatory Visit: Payer: Self-pay | Admitting: Physician Assistant

## 2024-01-02 DIAGNOSIS — E1165 Type 2 diabetes mellitus with hyperglycemia: Secondary | ICD-10-CM

## 2024-01-02 LAB — URINE CULTURE
MICRO NUMBER:: 16285320
SPECIMEN QUALITY:: ADEQUATE

## 2024-01-02 MED ORDER — INSULIN LISPRO PROT & LISPRO (75-25 MIX) 100 UNIT/ML KWIKPEN: 15.0000 [IU] | PEN_INJECTOR | Freq: Two times a day (BID) | SUBCUTANEOUS | 11 refills | Status: DC

## 2024-01-04 ENCOUNTER — Encounter: Payer: Self-pay | Admitting: Physician Assistant

## 2024-01-05 ENCOUNTER — Telehealth: Payer: Self-pay

## 2024-01-05 ENCOUNTER — Other Ambulatory Visit: Payer: Self-pay | Admitting: Physician Assistant

## 2024-01-05 ENCOUNTER — Other Ambulatory Visit: Payer: Self-pay

## 2024-01-05 DIAGNOSIS — E1165 Type 2 diabetes mellitus with hyperglycemia: Secondary | ICD-10-CM

## 2024-01-05 MED ORDER — HUMALOG MIX 75/25 KWIKPEN (75-25) 100 UNIT/ML ~~LOC~~ SUPN: 15.0000 [IU] | PEN_INJECTOR | Freq: Two times a day (BID) | SUBCUTANEOUS | 11 refills | Status: DC

## 2024-01-05 NOTE — Telephone Encounter (Signed)
 Fax sent to [phone] with tracking ID [id number] for Prior Auth approval of insulin and Freestyle Libre 3 for patient.

## 2024-01-05 NOTE — Telephone Encounter (Signed)
 Copied from CRM [id number]. Topic: Clinical - Prescription Issue >> Jan 05, 2024 12:21 PM Sim Boast F wrote: Reason for CRM: [facility] requested call back regarding prior authorization on patient Continuous Glucose Sensor (FREESTYLE LIBRE 3 PLUS SENSOR) MISC - Please call [phone] rx case number: [id number]  Returned call and spoke with Pharmacy Specialist; was advised Rx for insulin sent to pharmacy for PA was denied. Rep states Humalog 75/25 Stephanie Coup is preferred and need to place on prescription DAW to avoid denial;  also continuous glucose monitoring was also needing office notes stating patient is educated and knows how to complete continuous glucose monitoring, as well as the benefits of pt using this device for approval.

## 2024-01-06 ENCOUNTER — Other Ambulatory Visit (HOSPITAL_COMMUNITY): Payer: Self-pay

## 2024-01-06 ENCOUNTER — Telehealth: Payer: Self-pay

## 2024-01-06 ENCOUNTER — Other Ambulatory Visit: Payer: Self-pay

## 2024-01-06 NOTE — Telephone Encounter (Signed)
 Please see pt msg and advise correct way to do medication with the addition of insulin

## 2024-01-06 NOTE — Telephone Encounter (Signed)
 Patient has already been notified and picked up prescription.

## 2024-01-06 NOTE — Telephone Encounter (Signed)
 Pharmacy Patient Advocate Encounter  Received notification from [facility] that Prior Authorization for FREESTYLE LIBRE 3 PLUS SENSOR has been APPROVED from 01/05/24 to 07/03/24   PA #/Case ID/Reference #: [id number]   APPROVAL LETTER ATTACHED TO CHARTS

## 2024-01-08 ENCOUNTER — Ambulatory Visit: Admitting: Physician Assistant

## 2024-01-08 ENCOUNTER — Telehealth: Payer: Self-pay

## 2024-01-08 ENCOUNTER — Encounter: Payer: Self-pay | Admitting: Physician Assistant

## 2024-01-08 ENCOUNTER — Other Ambulatory Visit (HOSPITAL_COMMUNITY): Payer: Self-pay

## 2024-01-08 VITALS — BP 154/84 | HR 98 | Temp 97.5°F | Ht 65.0 in

## 2024-01-08 DIAGNOSIS — Z794 Long term (current) use of insulin: Secondary | ICD-10-CM | POA: Diagnosis not present

## 2024-01-08 DIAGNOSIS — E1165 Type 2 diabetes mellitus with hyperglycemia: Secondary | ICD-10-CM

## 2024-01-08 DIAGNOSIS — I10 Essential (primary) hypertension: Secondary | ICD-10-CM

## 2024-01-08 MED ORDER — OZEMPIC (0.25 OR 0.5 MG/DOSE) 2 MG/3ML ~~LOC~~ SOPN: PEN_INJECTOR | SUBCUTANEOUS | 0 refills | Status: DC

## 2024-01-08 NOTE — Telephone Encounter (Signed)
 Pharmacy Patient Advocate Encounter  Received notification from [facility] that Prior Authorization for Ozempic 2mg /15ml has been APPROVED from 12/25/23 to 01/07/25   PA #/Case ID/Reference #: [id number]  Approval letter indexed to media tab

## 2024-01-08 NOTE — Progress Notes (Signed)
 Patient ID: Debra Miles, female    DOB: 1996-09-03, 28 y.o.   MRN: [id number]   Assessment & Plan:  Uncontrolled type 2 diabetes mellitus with hyperglycemia, with long-term current use of insulin (HCC) -     Ozempic (0.25 or 0.5 MG/DOSE); Inject 0.25 mg into the skin once a week for 28 days, THEN 0.5 mg once a week for 28 days.  Dispense: 6 mL; Refill: 0  Essential hypertension    Assessment & Plan Uncontrolled Type 2 Diabetes Mellitus Debra Miles has uncontrolled type 2 diabetes mellitus with a recent hemoglobin A1c of 10.0. She uses a [employer] sensor for glucose monitoring and has been on Novolog insulin, though there is confusion regarding her regimen. She is interested in starting Ozempic, which is covered by her insurance, to improve glycemic control and reduce insulin dependency. Ozempic, if titrated slowly, is expected to yield better results. Potential side effects include nausea, diarrhea, constipation, and cramping. Debra Miles was considered but is not covered by her insurance. - Initiate Ozempic at 0.25 mg weekly for four weeks, then increase to 0.5 mg weekly. - Rotate injection sites for insulin and Ozempic. - Provide a sliding scale for insulin dosing based on blood glucose levels. - Encourage routine use of the Freestyle Libre sensor for glucose monitoring. - Educate on potential side effects of Ozempic, including nausea, diarrhea, constipation, and cramping. - Advise to contact the office if blood glucose exceeds 350 mg/dL.  Hypertension Debra Miles's hypertension is improving with medication adherence, with blood pressure reduced from 176/120 mmHg to 154/84 mmHg over the past week. The current management plan is effective. Lifestyle modifications are encouraged to further support blood pressure control. - Continue current antihypertensive medication regimen. - Encourage lifestyle modifications, including limiting alcohol, spicy foods, and caffeine. - Advise on the  importance of regular physical activity.  General Health Maintenance Debra Miles is making lifestyle changes to improve her overall health, including dietary modifications and reducing soda intake. She is aware of the impact of diet on her diabetes and hypertension and is motivated to make further changes. - Encourage continued dietary modifications, such as reducing sugar and carbohydrate intake. - Advise on the benefits of reading food labels and making informed dietary choices. - Support gradual lifestyle changes to improve long-term health outcomes.      No follow-ups on file.    Subjective:    Chief Complaint  Patient presents with   Diabetes    Pt in office today to discuss diabetes and education on insulin administration dosage; assisted patient with first application of Libre 3 sensor    Diabetes   Discussed the use of AI scribe software for clinical note transcription with the patient, who gave verbal consent to proceed.  History of Present Illness The patient is a 28 year old with uncontrolled type 2 diabetes and hypertension who presents for a recheck.  She is here for a follow-up on her uncontrolled type 2 diabetes. Her last hemoglobin A1c was 10.0. She uses a [employer] sensor to monitor her blood glucose levels and recently replaced a broken sensor. She is currently using Novolog Mix insulin, taking 15 units in the morning along with her blood pressure medication, and is working on establishing a nighttime routine for her medications. She has a history of using Trulicity, which she found effective, and is interested in exploring other medication options. She uses a 70/30 mix insulin pen and has needles at home. She did not pick up the medications prescribed at  discharge due to insurance issues. She has not started her sliding scale insulin yet.   Her blood pressure has improved from 176/120 at the last visit to 154/84 today, which she attributes to consistent  medication use. She is making efforts to adhere to her medication regimen, including setting reminders for medication adherence. She is aware of the need to limit spicy foods and caffeine, as these can affect her blood pressure.  She is trying to make healthier dietary choices, such as opting for a lettuce wrap instead of bread and reducing her intake of regular sodas.     Past Medical History:  Diagnosis Date   Anxiety    Diabetes (HCC)    HSV-1 infection    HSV-2 (herpes simplex virus 2) infection    Hypertension    PCOS (polycystic ovarian syndrome)     Past Surgical History:  Procedure Laterality Date   WISDOM TOOTH EXTRACTION      Family History  Problem Relation Age of Onset   Hyperthyroidism Mother    Hypertension Father    Hyperthyroidism Brother    Cancer Maternal Grandmother    Diabetes Paternal Grandmother    Diabetes Paternal Grandfather    Heart disease Paternal Grandfather    Kidney disease Paternal Grandfather    Hypertension Paternal Grandfather    Diabetes Paternal Aunt    Breast cancer Paternal Aunt    Breast cancer Paternal Aunt        "mom" to her - passed in April 2024 after pneumonia    Social History   Tobacco Use   Smoking status: Never   Smokeless tobacco: Never  Vaping Use   Vaping status: Some Days   Substances: Nicotine, Flavoring  Substance Use Topics   Alcohol use: Yes    Comment: occasionally   Drug use: Yes    Types: Marijuana     Allergies  Allergen Reactions   Metformin And Related Diarrhea and Nausea And Vomiting    Review of Systems NEGATIVE UNLESS OTHERWISE INDICATED IN HPI      Objective:     BP (!) 154/84 (BP Location: Left Arm, Patient Position: Sitting, Cuff Size: Normal)   Pulse 98   Temp (!) 97.5 F (36.4 C) (Temporal)   Ht 5\' 5"  (1.651 m)   LMP 11/22/2023 (Exact Date)   SpO2 100%   BMI 39.07 kg/m   Wt Readings from Last 3 Encounters:  01/01/24 234 lb 12.8 oz (106.5 kg)  12/27/23 233 lb 14.5 oz  (106.1 kg)  07/03/23 233 lb 12.8 oz (106.1 kg)    BP Readings from Last 3 Encounters:  01/08/24 (!) 154/84  01/01/24 (!) 176/120  12/28/23 (!) 142/88     Physical Exam Vitals and nursing note reviewed.  Constitutional:      Appearance: Normal appearance. She is obese.  Eyes:     Extraocular Movements: Extraocular movements intact.     Conjunctiva/sclera: Conjunctivae normal.     Pupils: Pupils are equal, round, and reactive to light.  Cardiovascular:     Rate and Rhythm: Normal rate and regular rhythm.  Pulmonary:     Effort: Pulmonary effort is normal.     Breath sounds: Normal breath sounds.  Neurological:     General: No focal deficit present.     Mental Status: She is alert and oriented to person, place, and time.  Psychiatric:        Mood and Affect: Mood normal.        Behavior: Behavior normal.  Coriana Angello M Azalya Galyon, PA-C

## 2024-01-08 NOTE — Patient Instructions (Addendum)
 Fast - Acting Insulin sliding scale schedule: Inject 0-11 Units into the skin 4 (four) times daily - before meals and at bedtime.  Check Blood Glucose (BG) and inject per scale:  BG <150= 0 unit;  BG 150-200= 1 unit;  BG 201-250= 3 unit;  BG 251-300= 5 unit;  BG 301-350= 7 unit;  BG 351-400= 9 unit;  BG >400= 11 unit and Call Primary Care.

## 2024-01-26 ENCOUNTER — Encounter: Payer: Self-pay | Admitting: Physician Assistant

## 2024-01-26 ENCOUNTER — Ambulatory Visit (INDEPENDENT_AMBULATORY_CARE_PROVIDER_SITE_OTHER): Admitting: Physician Assistant

## 2024-01-26 VITALS — BP 134/84 | HR 96 | Temp 98.2°F | Ht 65.0 in | Wt 230.6 lb

## 2024-01-26 DIAGNOSIS — F1090 Alcohol use, unspecified, uncomplicated: Secondary | ICD-10-CM | POA: Diagnosis not present

## 2024-01-26 DIAGNOSIS — E1165 Type 2 diabetes mellitus with hyperglycemia: Secondary | ICD-10-CM | POA: Diagnosis not present

## 2024-01-26 DIAGNOSIS — Z794 Long term (current) use of insulin: Secondary | ICD-10-CM | POA: Diagnosis not present

## 2024-01-26 DIAGNOSIS — I10 Essential (primary) hypertension: Secondary | ICD-10-CM

## 2024-01-26 MED ORDER — ONDANSETRON HCL 4 MG PO TABS: 4.0000 mg | ORAL_TABLET | Freq: Three times a day (TID) | ORAL | 0 refills | Status: DC | PRN

## 2024-01-26 MED ORDER — OZEMPIC (0.25 OR 0.5 MG/DOSE) 2 MG/3ML ~~LOC~~ SOPN: 0.5000 mg | PEN_INJECTOR | SUBCUTANEOUS | 1 refills | Status: DC

## 2024-01-26 NOTE — Progress Notes (Signed)
 Patient ID: Debra Miles, female    DOB: [birth year], 28 y.o.   MRN: [id number]   Assessment & Plan:  Uncontrolled type 2 diabetes mellitus with hyperglycemia, with long-term current use of insulin  (HCC) -     Ozempic  (0.25 or 0.5 MG/DOSE); Inject 0.5 mg into the skin once a week.  Dispense: 3 mL; Refill: 1  Essential hypertension  Alcohol use  Other orders -     Ondansetron  HCl; Take 1 tablet (4 mg total) by mouth every 8 (eight) hours as needed for nausea or vomiting.  Dispense: 20 tablet; Refill: 0     Assessment & Plan Type 2 diabetes mellitus with hyperglycemia Type 2 diabetes is managed with Novolog  insulin  and Ozempic . Blood glucose levels have improved to the 170s. She is on Ozempic  0.25 mg weekly, experiencing mild nausea, a common side effect. Plans to increase to 0.5 mg weekly after one more dose of 0.25 mg. She has lost 4 pounds and is encouraged to continue lifestyle modifications. Discussed using Zofran  for nausea if needed. - Continue Novolog  insulin  as prescribed - Increase Ozempic  to 0.5 mg weekly after one more dose of 0.25 mg - Prescribe Zofran  for nausea as needed - Encourage continued lifestyle modifications and weight loss Lab Results  Component Value Date   HGBA1C 10.0 (A) 01/01/2024   HGBA1C 10.3 (A) 07/03/2023   HGBA1C 9.9 (H) 09/03/2022     Hypertension Hypertension is well-controlled with current medication regimen. Blood pressure is 134/84 mmHg, improved from previous readings in the 170s/100s. She is adherent to morning medication but struggles with nighttime routine. - Continue Benicar  HCTZ 20-12.5 mg daily - Encourage adherence to medication regimen, especially nighttime dosing - Discuss lifestyle modifications to support blood pressure control  Alcohol use disorder She is working on reducing alcohol consumption, challenged by social influences. Alcohol use is not daily, and she is motivated to decrease intake further. Discussed risks of  alcohol use, including potential for cancer and liver disease. - Encourage reduction in alcohol consumption - Discuss risks of alcohol use and benefits of cessation - Set goal to reduce frequency of alcohol intake and avoid bars      Return in about 3 months (around 04/26/2024) for recheck/follow-up.    Subjective:    Chief Complaint  Patient presents with   Medical Management of Chronic Issues    Patient in office for 3 wk f/u for diabetes care and discuss medication adherence; pt states meds is going well, insulin  injection at night has been a struggle just because it's not a routine and her late night work schedule sometimes not getting home until 3am; glucose levels showing in green for the most part; replacement sensor should be arriving today;     HPI Discussed the use of AI scribe software for clinical note transcription with the patient, who gave verbal consent to proceed.  History of Present Illness Debra Miles is a 28 year old female with uncontrolled diabetes who presents for follow-up on her diabetes management.  She has not needed to use her fast-acting insulin  recently. She experiences occasional hyperglycemia, which she attributes to impulsive dietary choices. When adhering to a healthier diet, her blood glucose levels improve, averaging around 170 mg/dL, compared to the 409W during her hospitalization.  She has been on Ozempic  for three weeks, administering her third injection yesterday. She experiences nausea on the first day after the injection, which is alleviated faster when she injects in her leg rather than her stomach.  She has lost four pounds since starting Ozempic .  She is currently taking Novolog  insulin  twice daily but sometimes misses the second dose due to work and other activities. She is also on Ozempic  0.25 mg weekly, with plans to increase to 0.5 mg.  She has been diligent about taking her blood pressure medication, Benicar  HCTZ 20-12.5 mg daily.  Her blood pressure has improved significantly, with a recent reading of 134/84 mmHg, compared to previous readings in the 170s/100s.  She discusses her alcohol consumption, admitting to getting drunk the previous night but notes that it is no longer an everyday occurrence. She attributes her drinking to social influences, particularly mentioning a friend who is also on blood pressure medication. She acknowledges the health risks associated with alcohol, especially given her history of fatty liver disease.  She mentions feeling better overall, with more energy and a general sense of well-being. She is working on making lifestyle changes and is planning to set up a physical exam soon.     Past Medical History:  Diagnosis Date   Anxiety    Diabetes (HCC)    HSV-1 infection    HSV-2 (herpes simplex virus 2) infection    Hypertension    PCOS (polycystic ovarian syndrome)     Past Surgical History:  Procedure Laterality Date   WISDOM TOOTH EXTRACTION      Family History  Problem Relation Age of Onset   Hyperthyroidism Mother    Hypertension Father    Hyperthyroidism Brother    Cancer Maternal Grandmother    Diabetes Paternal Grandmother    Diabetes Paternal Grandfather    Heart disease Paternal Grandfather    Kidney disease Paternal Grandfather    Hypertension Paternal Grandfather    Diabetes Paternal Aunt    Breast cancer Paternal Aunt    Breast cancer Paternal Aunt        "mom" to her - passed in April 2024 after pneumonia    Social History   Tobacco Use   Smoking status: Never   Smokeless tobacco: Never  Vaping Use   Vaping status: Some Days   Substances: Nicotine, Flavoring  Substance Use Topics   Alcohol use: Yes    Comment: occasionally   Drug use: Yes    Types: Marijuana     Allergies  Allergen Reactions   Metformin  And Related Diarrhea and Nausea And Vomiting    Review of Systems NEGATIVE UNLESS OTHERWISE INDICATED IN HPI      Objective:     BP  134/84 (BP Location: Left Arm, Patient Position: Sitting, Cuff Size: Large)   Pulse 96   Temp 98.2 F (36.8 C) (Temporal)   Ht 5\' 5"  (1.651 m)   Wt 230 lb 9.6 oz (104.6 kg)   LMP 12/30/2023 (Exact Date)   SpO2 96%   BMI 38.37 kg/m   Wt Readings from Last 3 Encounters:  01/26/24 230 lb 9.6 oz (104.6 kg)  01/01/24 234 lb 12.8 oz (106.5 kg)  12/27/23 233 lb 14.5 oz (106.1 kg)    BP Readings from Last 3 Encounters:  01/26/24 134/84  01/08/24 (!) 154/84  01/01/24 (!) 176/120     Physical Exam Vitals and nursing note reviewed.  Constitutional:      Appearance: Normal appearance. She is obese.  Eyes:     Extraocular Movements: Extraocular movements intact.     Conjunctiva/sclera: Conjunctivae normal.     Pupils: Pupils are equal, round, and reactive to light.  Cardiovascular:     Rate and Rhythm:  Normal rate and regular rhythm.  Pulmonary:     Effort: Pulmonary effort is normal.     Breath sounds: Normal breath sounds.  Neurological:     General: No focal deficit present.     Mental Status: She is alert and oriented to person, place, and time.  Psychiatric:        Mood and Affect: Mood normal.        Behavior: Behavior normal.             Itzia Cunliffe M Shawntina Diffee, PA-C

## 2024-01-26 NOTE — Patient Instructions (Signed)
 Debra Miles, I'm so proud of you!  Your best is yet to come - keep pushing hard every day. Take that extra time to walk. Choose water over juice and alcohol. YOU can do this.  Can't wait to see your progress in 3 months.  Call sooner if needing anything!!

## 2024-03-01 ENCOUNTER — Encounter: Admitting: Physician Assistant

## 2024-03-06 ENCOUNTER — Telehealth: Admitting: Physician Assistant

## 2024-03-06 DIAGNOSIS — R062 Wheezing: Secondary | ICD-10-CM | POA: Diagnosis not present

## 2024-03-06 DIAGNOSIS — J208 Acute bronchitis due to other specified organisms: Secondary | ICD-10-CM | POA: Diagnosis not present

## 2024-03-06 MED ORDER — QVAR REDIHALER 40 MCG/ACT IN AERB: 1.0000 | INHALATION_SPRAY | Freq: Two times a day (BID) | RESPIRATORY_TRACT | 0 refills | Status: DC

## 2024-03-06 MED ORDER — BENZONATATE 100 MG PO CAPS: 100.0000 mg | ORAL_CAPSULE | Freq: Three times a day (TID) | ORAL | 0 refills | Status: DC | PRN

## 2024-03-06 NOTE — Patient Instructions (Signed)
 Debra Miles, thank you for joining Hyla Maillard, PA-C for today's virtual visit.  While this provider is not your primary care provider (PCP), if your PCP is located in our provider database this encounter information will be shared with them immediately following your visit.   A [facility] MyChart account gives you access to today's visit and all your visits, tests, and labs performed at [facility] " click here if you don't have a [facility] MyChart account or go to [email or web address]  Consent: (Patient) Debra Miles provided verbal consent for this virtual visit at the beginning of the encounter.  Current Medications:  Current Outpatient Medications:    albuterol  (VENTOLIN  HFA) 108 (90 Base) MCG/ACT inhaler, Inhale 2 puffs into the lungs every 6 (six) hours as needed for wheezing or shortness of breath (Cough)., Disp: 18 g, Rfl: 0   BD PEN NEEDLE NANO 2ND GEN 32G X 4 MM MISC, 1 EACH BY DOES NOT APPLY ROUTE 3 (THREE) TIMES DAILY, Disp: , Rfl:    Continuous Glucose Sensor (FREESTYLE LIBRE 3 PLUS SENSOR) MISC, Change sensor every 15 days., Disp: 1 each, Rfl: 11   fluticasone  (FLONASE ) 50 MCG/ACT nasal spray, Place 2 sprays into both nostrils daily., Disp: 16 g, Rfl: 3   insulin  aspart (NOVOLOG ) 100 UNIT/ML injection, Inject 11 Units into the skin 3 (three) times daily before meals. Follow sliding scale. (Patient not taking: Reported on 01/26/2024), Disp: , Rfl:    Insulin  Pen Needle (PEN NEEDLES) 31G X 5 MM MISC, 1 each by Does not apply route 3 (three) times daily. May dispense any manufacturer covered by patient's insurance., Disp: 100 each, Rfl: 11   NOVOLOG  MIX 70/30 FLEXPEN (70-30) 100 UNIT/ML FlexPen, Inject into the skin 2 (two) times daily with a meal., Disp: , Rfl:    olmesartan -hydrochlorothiazide  (BENICAR  HCT) 20-12.5 MG tablet, Take 1 tablet by mouth daily., Disp: 90 tablet, Rfl: 1   ondansetron  (ZOFRAN ) 4 MG tablet, Take 1 tablet (4 mg total) by  mouth every 8 (eight) hours as needed for nausea or vomiting., Disp: 20 tablet, Rfl: 0   Semaglutide ,0.25 or 0.5MG /DOS, (OZEMPIC , 0.25 OR 0.5 MG/DOSE,) 2 MG/3ML SOPN, Inject 0.5 mg into the skin once a week., Disp: 3 mL, Rfl: 1   Medications ordered in this encounter:  No orders of the defined types were placed in this encounter.    *If you need refills on other medications prior to your next appointment, please contact your pharmacy*  Follow-Up: Call back or seek an in-person evaluation if the symptoms worsen or if the condition fails to improve as anticipated.  [facility] [phone]  Other Instructions Increase fluids and rest. Refrain from smoking, at least during this episode if nothing else.  Mucinex-DM OTC for congestion and cough. Use your albuterol  inhaler as directed, when needed. I have sent in a prescription inhaled steroid and cough medication to use.  Follow-up in person for any non-resolving, new or worsening symptoms despite treatment.   If you have been instructed to have an in-person evaluation today at a local Urgent Care facility, please use the link below. It will take you to a list of all of our available  Urgent Cares, including address, phone number and hours of operation. Please do not delay care.   Urgent Cares  If you or a family member do not have a primary care provider, use the link below to schedule a visit and establish care. When you choose  a [facility] primary care physician or advanced practice provider, you gain a long-term partner in health. Find a Primary Care Provider  Learn more about [facility]'s in-office and virtual care options: [facility] - Get Care Now

## 2024-03-06 NOTE — Progress Notes (Signed)
 Virtual Visit Consent   Debra Miles, you are scheduled for a virtual visit with a [facility] provider today. Just as with appointments in the office, your consent must be obtained to participate. Your consent will be active for this visit and any virtual visit you may have with one of our providers in the next 365 days. If you have a MyChart account, a copy of this consent can be sent to you electronically.  As this is a virtual visit, video technology does not allow for your provider to perform a traditional examination. This may limit your provider's ability to fully assess your condition. If your provider identifies any concerns that need to be evaluated in person or the need to arrange testing (such as labs, EKG, etc.), we will make arrangements to do so. Although advances in technology are sophisticated, we cannot ensure that it will always work on either your end or our end. If the connection with a video visit is poor, the visit may have to be switched to a telephone visit. With either a video or telephone visit, we are not always able to ensure that we have a secure connection.  By engaging in this virtual visit, you consent to the provision of healthcare and authorize for your insurance to be billed (if applicable) for the services provided during this visit. Depending on your insurance coverage, you may receive a charge related to this service.  I need to obtain your verbal consent now. Are you willing to proceed with your visit today? Debra Miles has provided verbal consent on 03/06/2024 for a virtual visit (video or telephone). Hyla Maillard, [place]  Date: 03/06/2024 12:48 PM   Virtual Visit via Video Note   I, Hyla Maillard, connected with  Debra Miles  ([id number], [birth year]) on 03/06/24 at 12:45 PM EDT by a video-enabled telemedicine application and verified that I am speaking with the correct person using two identifiers.  Location: Patient: Virtual Visit  Location Patient: Home Provider: Virtual Visit Location Provider: Home Office   I discussed the limitations of evaluation and management by telemedicine and the availability of in person appointments. The patient expressed understanding and agreed to proceed.    History of Present Illness: Debra Miles is a 28 y.o. who identifies as a female who was assigned female at birth, and is being seen today for URI symptoms starting 3 days of cough that is mainly dry but now productive of phlegm. Notes slightly worsening since onset. Notes she does smoke marijuana and has had increased use recently. Noted some chest tightness and wheezing. Took home COVID test last night to be cautious -- negative. Does have albuterol  but was unable to find it until this morning. Took a dose a bit ago with some improvement.  HPI: HPI  Problems:  Patient Active Problem List   Diagnosis Date Noted   Abnormal uterine bleeding 01/01/2024   Irregular periods 01/01/2024   Vaginal irritation 01/01/2024   Pyelonephritis 12/27/2023   Fatty liver 07/03/2023   Gallstones 07/03/2023   Hypertensive disorder 10/08/2022   Polycystic ovaries 10/08/2022   Family history of breast cancer in female 09/19/2022   Bacterial vaginosis 09/19/2022   HSV (herpes simplex virus) infection 09/03/2022   Type 2 diabetes mellitus with hyperglycemia, without long-term current use of insulin  (HCC) 09/03/2022   PCOS (polycystic ovarian syndrome) 09/03/2022   Essential hypertension 09/03/2022   Type 2 diabetes mellitus (HCC) 04/22/2012   Polycystic ovarian syndrome 04/20/2012  Allergies:  Allergies  Allergen Reactions   Metformin  And Related Diarrhea and Nausea And Vomiting   Medications:  Current Outpatient Medications:    beclomethasone (QVAR REDIHALER) 40 MCG/ACT inhaler, Inhale 1 puff into the lungs 2 (two) times daily., Disp: 1 each, Rfl: 0   benzonatate  (TESSALON ) 100 MG capsule, Take 1 capsule (100 mg total) by mouth 3 (three)  times daily as needed for cough., Disp: 30 capsule, Rfl: 0   albuterol  (VENTOLIN  HFA) 108 (90 Base) MCG/ACT inhaler, Inhale 2 puffs into the lungs every 6 (six) hours as needed for wheezing or shortness of breath (Cough)., Disp: 18 g, Rfl: 0   BD PEN NEEDLE NANO 2ND GEN 32G X 4 MM MISC, 1 EACH BY DOES NOT APPLY ROUTE 3 (THREE) TIMES DAILY, Disp: , Rfl:    Continuous Glucose Sensor (FREESTYLE LIBRE 3 PLUS SENSOR) MISC, Change sensor every 15 days., Disp: 1 each, Rfl: 11   fluticasone  (FLONASE ) 50 MCG/ACT nasal spray, Place 2 sprays into both nostrils daily., Disp: 16 g, Rfl: 3   insulin  aspart (NOVOLOG ) 100 UNIT/ML injection, Inject 11 Units into the skin 3 (three) times daily before meals. Follow sliding scale. (Patient not taking: Reported on 01/26/2024), Disp: , Rfl:    Insulin  Pen Needle (PEN NEEDLES) 31G X 5 MM MISC, 1 each by Does not apply route 3 (three) times daily. May dispense any manufacturer covered by patient's insurance., Disp: 100 each, Rfl: 11   NOVOLOG  MIX 70/30 FLEXPEN (70-30) 100 UNIT/ML FlexPen, Inject into the skin 2 (two) times daily with a meal., Disp: , Rfl:    olmesartan -hydrochlorothiazide  (BENICAR  HCT) 20-12.5 MG tablet, Take 1 tablet by mouth daily., Disp: 90 tablet, Rfl: 1   ondansetron  (ZOFRAN ) 4 MG tablet, Take 1 tablet (4 mg total) by mouth every 8 (eight) hours as needed for nausea or vomiting., Disp: 20 tablet, Rfl: 0   Semaglutide ,0.25 or 0.5MG /DOS, (OZEMPIC , 0.25 OR 0.5 MG/DOSE,) 2 MG/3ML SOPN, Inject 0.5 mg into the skin once a week., Disp: 3 mL, Rfl: 1  Observations/Objective: Patient is well-developed, well-nourished in no acute distress.  Resting comfortably at home.  Head is normocephalic, atraumatic.  No labored breathing. Speech is clear and coherent with logical content.  Patient is alert and oriented at baseline.   Assessment and Plan: 1. Viral bronchitis (Primary) - benzonatate  (TESSALON ) 100 MG capsule; Take 1 capsule (100 mg total) by mouth 3  (three) times daily as needed for cough.  Dispense: 30 capsule; Refill: 0 - beclomethasone (QVAR REDIHALER) 40 MCG/ACT inhaler; Inhale 1 puff into the lungs 2 (two) times daily.  Dispense: 1 each; Refill: 0  2. Wheezing - beclomethasone (QVAR REDIHALER) 40 MCG/ACT inhaler; Inhale 1 puff into the lungs 2 (two) times daily.  Dispense: 1 each; Refill: 0  Supportive measures and OTC medications reviewed. Continue use of albuterol . Avoiding oral steroids as last A1C at 10. Will send in course of Qvar to use until this clams down. Tessalon  per orders. Strict in-person follow-up precautions reviewed.   Follow Up Instructions: I discussed the assessment and treatment plan with the patient. The patient was provided an opportunity to ask questions and all were answered. The patient agreed with the plan and demonstrated an understanding of the instructions.  A copy of instructions were sent to the patient via MyChart unless otherwise noted below.   The patient was advised to call back or seek an in-person evaluation if the symptoms worsen or if the condition fails to improve as anticipated.  Hyla Maillard, PA-C

## 2024-03-16 ENCOUNTER — Encounter: Admitting: Physician Assistant

## 2024-04-06 ENCOUNTER — Telehealth: Payer: Self-pay

## 2024-04-06 ENCOUNTER — Other Ambulatory Visit: Payer: Self-pay

## 2024-04-06 DIAGNOSIS — E1165 Type 2 diabetes mellitus with hyperglycemia: Secondary | ICD-10-CM

## 2024-04-06 NOTE — Telephone Encounter (Signed)
 Patient was identified as falling into the [place] Measure - Diabetes.   Patient was: Left voicemail to schedule with primary care provider.  Pt ok for lab only appt, future orders placed and follow up as scheduled 04/26/24.

## 2024-04-07 ENCOUNTER — Telehealth: Payer: Self-pay

## 2024-04-07 NOTE — Telephone Encounter (Signed)
 Patient was identified as falling into the [place] Measure - Diabetes.   Patient was: Left voicemail to schedule with primary care provider.

## 2024-04-26 ENCOUNTER — Ambulatory Visit: Admitting: Physician Assistant

## 2024-04-26 ENCOUNTER — Other Ambulatory Visit: Payer: Self-pay | Admitting: Physician Assistant

## 2024-04-26 DIAGNOSIS — I10 Essential (primary) hypertension: Secondary | ICD-10-CM

## 2024-05-24 ENCOUNTER — Encounter: Admitting: Physician Assistant

## 2024-05-31 ENCOUNTER — Other Ambulatory Visit: Payer: Self-pay | Admitting: Physician Assistant

## 2024-06-01 ENCOUNTER — Ambulatory Visit: Admitting: Physician Assistant

## 2024-06-01 NOTE — Telephone Encounter (Signed)
 PLEASE ADVISE PREV FILLED BY HISTORICAL PROVIDER

## 2024-06-07 ENCOUNTER — Other Ambulatory Visit (HOSPITAL_COMMUNITY)
Admission: RE | Admit: 2024-06-07 | Discharge: 2024-06-07 | Disposition: A | Discharge status: Home / Self Care | Source: Ambulatory Visit | Attending: Physician Assistant | Admitting: Physician Assistant

## 2024-06-07 ENCOUNTER — Encounter: Payer: Self-pay | Admitting: Physician Assistant

## 2024-06-07 ENCOUNTER — Ambulatory Visit (INDEPENDENT_AMBULATORY_CARE_PROVIDER_SITE_OTHER): Admitting: Physician Assistant

## 2024-06-07 VITALS — BP 176/102 | HR 100 | Temp 97.3°F | Ht 65.0 in | Wt 217.6 lb

## 2024-06-07 DIAGNOSIS — Z23 Encounter for immunization: Secondary | ICD-10-CM

## 2024-06-07 DIAGNOSIS — Z135 Encounter for screening for eye and ear disorders: Secondary | ICD-10-CM

## 2024-06-07 DIAGNOSIS — Z7985 Long-term (current) use of injectable non-insulin antidiabetic drugs: Secondary | ICD-10-CM | POA: Diagnosis not present

## 2024-06-07 DIAGNOSIS — D582 Other hemoglobinopathies: Secondary | ICD-10-CM

## 2024-06-07 DIAGNOSIS — S80212A Abrasion, left knee, initial encounter: Secondary | ICD-10-CM

## 2024-06-07 DIAGNOSIS — F39 Unspecified mood [affective] disorder: Secondary | ICD-10-CM

## 2024-06-07 DIAGNOSIS — Z113 Encounter for screening for infections with a predominantly sexual mode of transmission: Secondary | ICD-10-CM

## 2024-06-07 DIAGNOSIS — N898 Other specified noninflammatory disorders of vagina: Secondary | ICD-10-CM

## 2024-06-07 DIAGNOSIS — E1165 Type 2 diabetes mellitus with hyperglycemia: Secondary | ICD-10-CM

## 2024-06-07 DIAGNOSIS — I10 Essential (primary) hypertension: Secondary | ICD-10-CM

## 2024-06-07 DIAGNOSIS — Z1159 Encounter for screening for other viral diseases: Secondary | ICD-10-CM

## 2024-06-07 DIAGNOSIS — F1090 Alcohol use, unspecified, uncomplicated: Secondary | ICD-10-CM

## 2024-06-07 DIAGNOSIS — B009 Herpesviral infection, unspecified: Secondary | ICD-10-CM

## 2024-06-07 LAB — CBC WITH DIFFERENTIAL/PLATELET
Basophils Absolute: 0 K/uL (ref 0.0–0.1)
Basophils Relative: 0.2 % (ref 0.0–3.0)
Eosinophils Absolute: 0.1 K/uL (ref 0.0–0.7)
Eosinophils Relative: 0.8 % (ref 0.0–5.0)
HCT: 44 % (ref 36.0–46.0)
Hemoglobin: 15 g/dL (ref 12.0–15.0)
Lymphocytes Relative: 11.6 % — ABNORMAL LOW (ref 12.0–46.0)
Lymphs Abs: 1.7 K/uL (ref 0.7–4.0)
MCHC: 34.1 g/dL (ref 30.0–36.0)
MCV: 88.9 fl (ref 78.0–100.0)
Monocytes Absolute: 0.8 K/uL (ref 0.1–1.0)
Monocytes Relative: 5.2 % (ref 3.0–12.0)
Neutro Abs: 12.1 K/uL — ABNORMAL HIGH (ref 1.4–7.7)
Neutrophils Relative %: 82.2 % — ABNORMAL HIGH (ref 43.0–77.0)
Platelets: 409 K/uL — ABNORMAL HIGH (ref 150.0–400.0)
RBC: 4.95 Mil/uL (ref 3.87–5.11)
RDW: 12.3 % (ref 11.5–15.5)
WBC: 14.7 K/uL — ABNORMAL HIGH (ref 4.0–10.5)

## 2024-06-07 LAB — COMPREHENSIVE METABOLIC PANEL WITH GFR
ALT: 27 U/L (ref 0–35)
AST: 43 U/L — ABNORMAL HIGH (ref 0–37)
Albumin: 4.1 g/dL (ref 3.5–5.2)
Alkaline Phosphatase: 55 U/L (ref 39–117)
BUN: 13 mg/dL (ref 6–23)
CO2: 27 meq/L (ref 19–32)
Calcium: 9.3 mg/dL (ref 8.4–10.5)
Chloride: 102 meq/L (ref 96–112)
Creatinine, Ser: 0.73 mg/dL (ref 0.40–1.20)
GFR: 111.23 mL/min (ref >60.00)
Glucose, Bld: 170 mg/dL — ABNORMAL HIGH (ref 70–99)
Potassium: 3.8 meq/L (ref 3.5–5.1)
Sodium: 138 meq/L (ref 135–145)
Total Bilirubin: 0.4 mg/dL (ref 0.2–1.2)
Total Protein: 7.4 g/dL (ref 6.0–8.3)

## 2024-06-07 LAB — LIPID PANEL
Cholesterol: 131 mg/dL (ref 0–200)
HDL: 44.9 mg/dL (ref >39.00)
LDL Cholesterol: 60 mg/dL (ref 0–99)
NonHDL: 85.67
Total CHOL/HDL Ratio: 3
Triglycerides: 127 mg/dL (ref 0.0–149.0)
VLDL: 25.4 mg/dL (ref 0.0–40.0)

## 2024-06-07 LAB — MICROALBUMIN / CREATININE URINE RATIO
Creatinine,U: 168 mg/dL
Microalb Creat Ratio: 71.7 mg/g — ABNORMAL HIGH (ref 0.0–30.0)
Microalb, Ur: 12 mg/dL — ABNORMAL HIGH (ref 0.0–1.9)

## 2024-06-07 LAB — HEMOGLOBIN A1C: Hgb A1c MFr Bld: 6.7 % — ABNORMAL HIGH (ref 4.6–6.5)

## 2024-06-07 MED ORDER — OZEMPIC (0.25 OR 0.5 MG/DOSE) 2 MG/3ML ~~LOC~~ SOPN: 0.5000 mg | PEN_INJECTOR | SUBCUTANEOUS | 1 refills | Status: DC

## 2024-06-07 MED ORDER — VALACYCLOVIR HCL 1 G PO TABS: ORAL_TABLET | ORAL | 1 refills | Status: DC

## 2024-06-07 NOTE — Progress Notes (Signed)
 Patient ID: Debra Miles, female    DOB: [birth year], 28 y.o.   MRN: [id number]   Assessment & Plan:  Uncontrolled type 2 diabetes mellitus with hyperglycemia, with long-term current use of insulin  (HCC) -     Comprehensive metabolic panel with GFR -     Hemoglobin A1c -     Lipid panel -     Microalbumin / creatinine urine ratio -     Ambulatory referral to Ophthalmology -     Ozempic  (0.25 or 0.5 MG/DOSE); Inject 0.5 mg into the skin once a week.  Dispense: 3 mL; Refill: 1  Immunization due -     Flu vaccine trivalent PF, 6mos and older(Flulaval,Afluria,Fluarix,Fluzone)  Screening for diabetic retinopathy -     Ambulatory referral to Ophthalmology  Abrasion of left knee, initial encounter  Elevated hemoglobin (HCC) -     CBC with Differential/Platelet  Need for hepatitis C screening test -     Hepatitis C antibody  Vaginal discharge -     Cervicovaginal ancillary only  Screen for STD (sexually transmitted disease) -     Cervicovaginal ancillary only  Alcohol use -     Ambulatory referral to Psychiatry  Mood disorder [facility]) -     Ambulatory referral to Psychiatry      Assessment and Plan Assessment & Plan Essential hypertension, uncontrolled Blood pressure is 176/102 mmHg. She has not been taking her prescribed antihypertensive medication and has been using Adderall obtained off the street, which can exacerbate hypertension. No signs of end organ damage on examination. - Restart antihypertensive medication - Monitor blood pressure at home - Follow up in one month  Type 2 diabetes mellitus with hyperglycemia, uncontrolled Last A1c was 10.0% five months ago. She has not been taking insulin  regularly but has been using Ozempic  0.5 mg, which she feels has improved her glycemic control. Blood sugars have been under 200 mg/dL. - Continue Ozempic  0.5 mg - Order labs to check A1c, cholesterol, sodium, potassium, and CBC - Refer to ophthalmology for diabetic eye  exam Lab Results  Component Value Date   HGBA1C 10.0 (A) 01/01/2024   HGBA1C 10.3 (A) 07/03/2023   HGBA1C 9.9 (H) 09/03/2022     Alcohol use disorder Reports significant reduction in alcohol consumption following a DWI on August 3rd. Has had one drink in the last two weeks. Acknowledges the need to address alcohol use. - Refer to [facility] for assessment and possible treatment  Mood disorder, unspecified Reports using Adderall to cope with stress from working two jobs and feeling like it helps her function normally. Discussed the possibility of underlying mood disorder or ADHD. - Refer to [facility] for psychiatric evaluation and possible medication management  Stimulant (Adderall) misuse Admits to using Adderall obtained off the street to help manage stress and workload. Aware of potential risks, including exacerbation of hypertension. - Advise discontinuation of Adderall use - Refer to [facility] for evaluation and support  Elevated hemoglobin Previous labs indicated elevated hemoglobin, possibly due to thickened blood. - Order labs to recheck hemoglobin levels  Abrasion of left knee Sustained a knee abrasion at work on August 27th. Initially appeared to be healing but noted some pus, likely due to sweat. No signs of infection currently. - Monitor for signs of infection such as odor, increased redness, or fever - Consider antibiotics if signs of infection develop  Vaginal discharge, evaluation for bacterial vaginosis and sexually transmitted infections Reports strong odor without  itching, suspecting bacterial vaginosis. No recent sexual activity. Discharge is white and normal in appearance. - Perform vaginal swab to test for STDs, BV, and yeast - Treat according to test results  Herpes simplex virus infection (cold sores) No current outbreak reported. - Prescribe Valtrex  for HSV management    F/up as scheduled    Subjective:     Chief Complaint  Patient presents with   Diabetes    Pt in office for diabetes f/u and labs; pt states been working two jobs; moved recently; pt fell at work and has left knee injury and admits been keeping it clean with antibacterial soap; triple antibiotic; but noticed puss present over the weekend; pt also admits possible BV; foul odor to urine and  requests STI check. Declines any issues with urinating or other symptoms;     HPI Discussed the use of AI scribe software for clinical note transcription with the patient, who gave verbal consent to proceed.  History of Present Illness Debra Miles is a 28 year old female who presents with a knee injury and concerns about medication use.  She sustained a knee injury at work approximately two weeks ago after slipping on a carpet mat and falling, resulting in a carpet burn and soreness. Initially, the knee was swollen and later developed pus, which she attributes to sweat from gym activities. She experiences significant pain, but the swelling has decreased. She is concerned about potential infection but has no fever or significant redness.  She has type 2 diabetes, with blood sugar levels under 200 mg/dL. She manages her diabetes with Ozempic  0.5 mg and has not been using her prescribed insulin . She has lost approximately 17 pounds over the past five to six months. Her last A1c was 10.0% five months ago.  She has hypertension but has not been taking her blood pressure medication recently. She uses Adderall obtained from an unreliable source, which she believes may affect her blood pressure.  She mentions a recent DWI, leading her to significantly reduce alcohol consumption, having only one drink in the last two weeks.  She reports a strong vaginal odor without itching or significant discharge, suspecting bacterial vaginosis. She has not been sexually active for several months and does not wear underwear regularly.  She has a history of  HSV, experiencing cold sores, and is currently prescribed Valtrex .     Past Medical History:  Diagnosis Date   Anxiety    Diabetes (HCC)    HSV-1 infection    HSV-2 (herpes simplex virus 2) infection    Hypertension    PCOS (polycystic ovarian syndrome)     Past Surgical History:  Procedure Laterality Date   WISDOM TOOTH EXTRACTION      Family History  Problem Relation Age of Onset   Hyperthyroidism Mother    Hypertension Father    Hyperthyroidism Brother    Cancer Maternal Grandmother    Diabetes Paternal Grandmother    Diabetes Paternal Grandfather    Heart disease Paternal Grandfather    Kidney disease Paternal Grandfather    Hypertension Paternal Grandfather    Diabetes Paternal Aunt    Breast cancer Paternal Aunt    Breast cancer Paternal Aunt        mom to her - passed in April 2024 after pneumonia    Social History   Tobacco Use   Smoking status: Never   Smokeless tobacco: Never  Vaping Use   Vaping status: Some Days   Substances: Nicotine, Flavoring  Substance  Use Topics   Alcohol use: Yes    Comment: occasionally   Drug use: Yes    Types: Marijuana     Allergies  Allergen Reactions   Metformin  And Related Diarrhea and Nausea And Vomiting    Review of Systems NEGATIVE UNLESS OTHERWISE INDICATED IN HPI      Objective:     BP (!) 172/104 (BP Location: Left Arm, Patient Position: Sitting, Cuff Size: Normal) Comment: pt admits not taken bp meds in months  Pulse 100   Temp (!) 97.3 F (36.3 C) (Temporal)   Ht 5' 5 (1.651 m)   Wt 217 lb 9.6 oz (98.7 kg)   SpO2 100%   BMI 36.21 kg/m   Wt Readings from Last 3 Encounters:  06/07/24 217 lb 9.6 oz (98.7 kg)  01/26/24 230 lb 9.6 oz (104.6 kg)  01/01/24 234 lb 12.8 oz (106.5 kg)    BP Readings from Last 3 Encounters:  06/07/24 (!) 172/104  01/26/24 134/84  01/08/24 (!) 154/84     Physical Exam Vitals and nursing note reviewed. Exam conducted with a chaperone present.   Constitutional:      Appearance: Normal appearance. She is obese.  Eyes:     Extraocular Movements: Extraocular movements intact.     Conjunctiva/sclera: Conjunctivae normal.     Pupils: Pupils are equal, round, and reactive to light.  Cardiovascular:     Rate and Rhythm: Normal rate and regular rhythm.  Pulmonary:     Effort: Pulmonary effort is normal.     Breath sounds: Normal breath sounds.  Genitourinary:    General: Normal vulva.  Skin:    Findings: Lesion (see photo - healing abrasion L knee) present.  Neurological:     General: No focal deficit present.     Mental Status: She is alert and oriented to person, place, and time.  Psychiatric:        Mood and Affect: Mood normal.        Behavior: Behavior normal.             Time Spent: 45 minutes of total time was spent on the date of the encounter performing the following actions: chart review prior to seeing the patient, obtaining history, performing a medically necessary exam, counseling on the treatment plan, placing orders, and documenting in our EHR.    Dyke Weible M Avrohom Mckelvin, PA-C

## 2024-06-07 NOTE — Patient Instructions (Signed)
  VISIT SUMMARY: Today, we addressed your knee injury, diabetes, high blood pressure, alcohol use, mood concerns, and other health issues. We discussed your current medications and made some adjustments to better manage your conditions.  YOUR PLAN: KNEE INJURY: You injured your knee at work, which initially swelled and later developed pus. The swelling has decreased, and there are no signs of infection currently. -Monitor your knee for signs of infection such as odor, increased redness, or fever. -If signs of infection develop, we may consider antibiotics.  TYPE 2 DIABETES: Your blood sugar levels have been under 200 mg/dL, but your last J8r was 10.0%. You have been using Ozempic  but not your prescribed insulin . -Continue taking Ozempic  0.5 mg. -We will order labs to check your A1c, cholesterol, sodium, potassium, and CBC. -You will be referred to ophthalmology for a diabetic eye exam.  HIGH BLOOD PRESSURE: Your blood pressure is high at 176/102 mmHg, and you have not been taking your prescribed medication. Using Adderall can worsen your blood pressure. -Restart your antihypertensive medication. -Monitor your blood pressure at home. -Follow up in one month.  ALCOHOL USE: You have significantly reduced your alcohol consumption after a recent DWI. -You will be referred to [facility] for assessment and possible treatment.  MOOD CONCERNS AND STIMULANT MISUSE: You have been using Adderall obtained off the street to cope with stress and workload, which can worsen your blood pressure. -Discontinue using Adderall. -You will be referred to [facility] for psychiatric evaluation and possible medication management.  ELEVATED HEMOGLOBIN: Previous labs indicated elevated hemoglobin levels. -We will order labs to recheck your hemoglobin levels.  VAGINAL ODOR: You reported a strong vaginal odor without itching, suspecting bacterial vaginosis. -We will perform a vaginal swab  to test for STDs, BV, and yeast. -Treatment will be based on the test results.  HERPES SIMPLEX VIRUS (COLD SORES): You have a history of cold sores but no current outbreak. -Continue taking Valtrex  as prescribed for HSV management.                      Contains text generated by [employer]bridge.                                 Contains text generated by [employer]bridge.

## 2024-06-08 LAB — CERVICOVAGINAL ANCILLARY ONLY
Bacterial Vaginitis (gardnerella): POSITIVE — AB
Candida Glabrata: NEGATIVE
Candida Vaginitis: POSITIVE — AB
Chlamydia: NEGATIVE
Comment: NEGATIVE
Comment: NEGATIVE
Comment: NEGATIVE
Comment: NEGATIVE
Comment: NEGATIVE
Comment: NORMAL
Neisseria Gonorrhea: NEGATIVE
Trichomonas: NEGATIVE

## 2024-06-08 LAB — HEPATITIS C ANTIBODY: Hepatitis C Ab: NONREACTIVE

## 2024-06-09 ENCOUNTER — Encounter: Payer: Self-pay | Admitting: Physician Assistant

## 2024-06-09 NOTE — Telephone Encounter (Signed)
 Please see pt request for medication for BV and abnormal lab results and advise

## 2024-06-10 ENCOUNTER — Ambulatory Visit: Payer: Self-pay | Admitting: Physician Assistant

## 2024-06-10 ENCOUNTER — Other Ambulatory Visit: Payer: Self-pay | Admitting: Physician Assistant

## 2024-06-10 MED ORDER — METRONIDAZOLE 500 MG PO TABS: 500.0000 mg | ORAL_TABLET | Freq: Two times a day (BID) | ORAL | 0 refills | Status: AC

## 2024-06-10 MED ORDER — FLUCONAZOLE 150 MG PO TABS: ORAL_TABLET | ORAL | 1 refills | Status: AC

## 2024-06-10 MED ORDER — ONDANSETRON HCL 4 MG PO TABS: 4.0000 mg | ORAL_TABLET | Freq: Three times a day (TID) | ORAL | 0 refills | Status: AC | PRN

## 2024-06-10 NOTE — Telephone Encounter (Unsigned)
 Copied from CRM #[id number]. Topic: Clinical - Medication Question >> Jun 10, 2024 10:31 AM Charolett L wrote: Reason for CRM: patient labs came back that she tested positive for BV and a Yest infection and needs a script sent to her pharmacy to clear it

## 2024-06-10 NOTE — Telephone Encounter (Signed)
 Msg already sent to PCP for review. Duplicate msg

## 2024-06-28 ENCOUNTER — Ambulatory Visit: Admitting: Physician Assistant

## 2024-07-06 ENCOUNTER — Encounter: Payer: Self-pay | Admitting: Physician Assistant

## 2024-07-06 ENCOUNTER — Ambulatory Visit: Admitting: Physician Assistant

## 2024-07-06 VITALS — BP 164/82 | HR 92 | Temp 97.7°F | Ht 65.0 in | Wt 219.0 lb

## 2024-07-06 DIAGNOSIS — B9689 Other specified bacterial agents as the cause of diseases classified elsewhere: Secondary | ICD-10-CM | POA: Diagnosis not present

## 2024-07-06 DIAGNOSIS — R809 Proteinuria, unspecified: Secondary | ICD-10-CM

## 2024-07-06 DIAGNOSIS — N760 Acute vaginitis: Secondary | ICD-10-CM

## 2024-07-06 DIAGNOSIS — Z7985 Long-term (current) use of injectable non-insulin antidiabetic drugs: Secondary | ICD-10-CM

## 2024-07-06 DIAGNOSIS — E1165 Type 2 diabetes mellitus with hyperglycemia: Secondary | ICD-10-CM

## 2024-07-06 MED ORDER — SEMAGLUTIDE (1 MG/DOSE) 4 MG/3ML ~~LOC~~ SOPN: 1.0000 mg | PEN_INJECTOR | SUBCUTANEOUS | 2 refills | Status: DC

## 2024-07-06 NOTE — Progress Notes (Signed)
 Patient ID: Debra Miles, female    DOB: [birth year], 28 y.o.   MRN: [id number]   Assessment & Plan:  Type 2 diabetes mellitus with hyperglycemia, without long-term current use of insulin  (HCC) -     Semaglutide  (1 MG/DOSE); Inject 1 mg as directed once a week.  Dispense: 3 mL; Refill: 2  Proteinuria, unspecified type  Bacterial vaginosis      Assessment and Plan Assessment & Plan Type 2 diabetes mellitus, controlled Type 2 diabetes mellitus is controlled with significant improvement in HbA1c from 10 to 6.7. She has made substantial lifestyle changes, including cessation of alcohol, increased physical activity, and dietary modifications. Currently on Ozempic  0.5 mg weekly, which has been effective in controlling her blood glucose levels without the need for rapid-acting insulin . She expressed interest in increasing the dose to 1 mg due to hitting a plateau with weight and sugar numbers. - Increase Ozempic  to 1 mg once weekly - Provide a handout on diabetes-friendly snacks - Encourage continuation of lifestyle modifications, including exercise and dietary changes - Reassess HbA1c in 8 weeks  Hypertension with proteinuria Hypertension with proteinuria is being managed with olmesartan  and hydrochlorothiazide . Blood pressure has improved since the last visit but remains elevated at 164/82 mmHg. Proteinuria is being monitored, and kidney function remains stable. Discussed the potential for kidney protection medication but decided to continue current management and monitor as her blood sugar improves. - Continue olmesartan /hydrochlorothiazide  20/12.5 mg - Encourage home blood pressure monitoring - Recheck blood pressure at the next physical exam  Recurrent bacterial vaginosis Recurrent bacterial vaginosis appears to be improving. She has started taking a daily vaginal probiotic, which she reports has helped with symptoms. - Continue daily vaginal probiotic      Return in about 3  months (around 10/06/2024) for recheck/follow-up.    Subjective:    Chief Complaint  Patient presents with   Hypertension   Diabetes    HPI Discussed the use of AI scribe software for clinical note transcription with the patient, who gave verbal consent to proceed.  History of Present Illness Debra Miles is a 28 year old female with hypertension and diabetes who presents for a follow-up visit regarding elevated microalbumin levels.  She is concerned about elevated microalbumin levels in her urine, which were noted previously. She has a history of hypertension and diabetes. She is currently taking olmesartan  and hydrochlorothiazide  (20/12.5 mg) for her blood pressure.  She has not been monitoring her blood pressure at home but has been compliant with her medication regimen. She has not needed to use any rapid-acting insulin  recently and is currently on Ozempic  for her diabetes.  She has made significant lifestyle changes, including quitting alcohol and increasing her physical activity by going to the gym daily. She reports feeling much better and has noticed improvements in her overall health. She is working on reducing her marijuana use and managing her snacking habits, especially in the evenings.  Her A1c has improved dramatically from 10 to 6.7, and she is tracking her calories and working on breaking old habits. She has experienced occasional nausea with Ozempic  but manages it with nausea medication. She is also taking a vaginal probiotic daily to address recurrent issues she has experienced in the past.  No new or worsening symptoms were reported during the review of symptoms. She feels good overall and is excited about the positive changes she has made.     Past Medical History:  Diagnosis Date   Anxiety  Diabetes (HCC)    HSV-1 infection    HSV-2 (herpes simplex virus 2) infection    Hypertension    PCOS (polycystic ovarian syndrome)     Past Surgical History:   Procedure Laterality Date   WISDOM TOOTH EXTRACTION      Family History  Problem Relation Age of Onset   Hyperthyroidism Mother    Hypertension Father    Hyperthyroidism Brother    Cancer Maternal Grandmother    Diabetes Paternal Grandmother    Diabetes Paternal Grandfather    Heart disease Paternal Grandfather    Kidney disease Paternal Grandfather    Hypertension Paternal Grandfather    Diabetes Paternal Aunt    Breast cancer Paternal Aunt    Breast cancer Paternal Aunt        mom to her - passed in April 2024 after pneumonia    Social History   Tobacco Use   Smoking status: Never   Smokeless tobacco: Never  Vaping Use   Vaping status: Some Days   Substances: Nicotine, Flavoring  Substance Use Topics   Alcohol use: Yes    Comment: occasionally   Drug use: Yes    Types: Marijuana     Allergies  Allergen Reactions   Metformin  And Related Diarrhea and Nausea And Vomiting    Review of Systems NEGATIVE UNLESS OTHERWISE INDICATED IN HPI      Objective:     BP (!) 164/82 (Cuff Size: Normal)   Pulse 92   Temp 97.7 F (36.5 C)   Ht 5' 5 (1.651 m)   Wt 219 lb (99.3 kg)   SpO2 92%   BMI 36.44 kg/m   Wt Readings from Last 3 Encounters:  07/06/24 219 lb (99.3 kg)  06/07/24 217 lb 9.6 oz (98.7 kg)  01/26/24 230 lb 9.6 oz (104.6 kg)    BP Readings from Last 3 Encounters:  07/06/24 (!) 164/82  06/07/24 (!) 176/102  01/26/24 134/84     Physical Exam Vitals and nursing note reviewed.  Constitutional:      Appearance: Normal appearance. She is obese.  Eyes:     Extraocular Movements: Extraocular movements intact.     Conjunctiva/sclera: Conjunctivae normal.     Pupils: Pupils are equal, round, and reactive to light.  Cardiovascular:     Rate and Rhythm: Normal rate and regular rhythm.  Pulmonary:     Effort: Pulmonary effort is normal.     Breath sounds: Normal breath sounds.  Musculoskeletal:     Right lower leg: No edema.     Left lower leg:  No edema.  Skin:    Findings: No rash.  Neurological:     General: No focal deficit present.     Mental Status: She is alert and oriented to person, place, and time.  Psychiatric:        Mood and Affect: Mood normal.        Behavior: Behavior normal.             Shae Augello M Aziah Kaiser, PA-C

## 2024-07-06 NOTE — Patient Instructions (Signed)
 So proud of you!!! Keep up the good work!

## 2024-07-08 NOTE — Telephone Encounter (Signed)
 Please see pt msg and advise recommendations

## 2024-07-13 ENCOUNTER — Other Ambulatory Visit (HOSPITAL_COMMUNITY): Payer: Self-pay

## 2024-07-13 NOTE — Telephone Encounter (Signed)
 Please see pt msg and complete PA's for FreeStyle Libre 3 plus sensors and Ozempic 

## 2024-07-14 ENCOUNTER — Telehealth: Payer: Self-pay

## 2024-07-14 ENCOUNTER — Other Ambulatory Visit (HOSPITAL_COMMUNITY): Payer: Self-pay

## 2024-07-14 NOTE — Telephone Encounter (Signed)
 Called pt lvm with PA information and office cb number

## 2024-07-14 NOTE — Telephone Encounter (Signed)
 Pharmacy Patient Advocate Encounter   Received notification from Patient Advice Request messages that prior authorization for FreeStyle Libre 3 Plus Sensor is required/requested.   Insurance verification completed.   The patient is insured through [employer] .   Per test claim: PA required; PA submitted to above mentioned insurance via Latent Key/confirmation #/EOC [id number] Status is pending

## 2024-07-14 NOTE — Telephone Encounter (Signed)
 Pharmacy Patient Advocate Encounter  Received notification from [facility]  that Prior Authorization for FreeStyle Libre 3 Plus Sensor  has been APPROVED from 07/14/24 to 07/14/25   PA #/Case ID/Reference #: [id number]

## 2024-07-24 ENCOUNTER — Other Ambulatory Visit: Payer: Self-pay | Admitting: Physician Assistant

## 2024-07-24 DIAGNOSIS — I10 Essential (primary) hypertension: Secondary | ICD-10-CM

## 2024-07-27 ENCOUNTER — Encounter: Admitting: Physician Assistant

## 2024-07-28 ENCOUNTER — Other Ambulatory Visit: Payer: Self-pay | Admitting: Physician Assistant

## 2024-07-28 ENCOUNTER — Other Ambulatory Visit: Payer: Self-pay

## 2024-07-28 DIAGNOSIS — E1165 Type 2 diabetes mellitus with hyperglycemia: Secondary | ICD-10-CM

## 2024-07-28 MED ORDER — SEMAGLUTIDE (1 MG/DOSE) 4 MG/3ML ~~LOC~~ SOPN: 1.0000 mg | PEN_INJECTOR | SUBCUTANEOUS | 2 refills | Status: AC

## 2024-09-01 ENCOUNTER — Other Ambulatory Visit: Payer: Self-pay | Admitting: Physician Assistant

## 2024-09-01 DIAGNOSIS — B009 Herpesviral infection, unspecified: Secondary | ICD-10-CM

## 2024-09-06 ENCOUNTER — Ambulatory Visit: Payer: Self-pay

## 2024-09-06 NOTE — Telephone Encounter (Signed)
 FYI Only or Action Required?: FYI only for provider: appointment scheduled on 12/9.  Patient was last seen in primary care on 07/06/2024 by Allwardt, Mardy HERO, PA-C.  Called Nurse Triage reporting Cough.  Symptoms began a week ago.  Interventions attempted: OTC medications: mucinex, nyquil, Albuterol  Inhaler.  Symptoms are: gradually worsening.  Triage Disposition: See HCP Within 4 Hours (Or PCP Triage)  Patient/caregiver understands and will follow disposition?: Yes  Copied from CRM #[id number]. Topic: Clinical - Red Word Triage >> Sep 06, 2024  4:45 PM Shanda MATSU wrote: Red Word that prompted transfer to Nurse Triage: Patient is reporting having a lingering cough from being sick last week, cough is causing shortness of breath. Reason for Disposition  [1] MILD difficulty breathing (e.g., minimal/no SOB at rest, SOB with walking, pulse < 100) AND [2] NEW-onset or WORSE than normal  Answer Assessment - Initial Assessment Questions URI last week with coughing and SOB episodes. Still with coughing fits that are causing SOB. Denies dizziness. Albuterol - TID- with no real improvement. Mucinex and NyQuil- understands that nyquil not great for BP She is concerned it has settled into her chest and that she has pneumonia.  Appt found with PCP office to assess. Pt understands ED/UC precautions   1. RESPIRATORY STATUS: Describe your breathing? (e.g., wheezing, shortness of breath, unable to speak, severe coughing)      Cough settled into chest, wheezy  2. ONSET: When did this breathing problem begin?      Since last week 3. PATTERN Does the difficult breathing come and go, or has it been constant since it started?      Comes and goes- with real bad coughing fit 4. SEVERITY: How bad is your breathing? (e.g., mild, moderate, severe)      Has to stop and catch her breath  5. RECURRENT SYMPTOM: Have you had difficulty breathing before? If Yes, ask: When was the last time? and What happened  that time?      Has asthma- 6. CARDIAC HISTORY: Do you have any history of heart disease? (e.g., heart attack, angina, bypass surgery, angioplasty)      HTN  7. LUNG HISTORY: Do you have any history of lung disease?  (e.g., pulmonary embolus, asthma, emphysema)     asthma 8. CAUSE: What do you think is causing the breathing problem?      Asthma and illness 9. OTHER SYMPTOMS: Do you have any other symptoms? (e.g., chest pain, cough, dizziness, fever, runny nose)     Her BF siad she felt like she had a fever yesterday 10. O2 SATURATION MONITOR:  Do you use an oxygen saturation monitor (pulse oximeter) at home? If Yes, ask: What is your reading (oxygen level) today? What is your usual oxygen saturation reading? (e.g., 95%)       Denies ability to monitor  11. PREGNANCY: Is there any chance you are pregnant? When was your last menstrual period?       Slight chance- but doubts it  Protocols used: Breathing Difficulty-A-AH

## 2024-09-07 ENCOUNTER — Other Ambulatory Visit: Payer: Self-pay

## 2024-09-07 ENCOUNTER — Ambulatory Visit: Admitting: Family Medicine

## 2024-09-07 VITALS — BP 138/84 | HR 76 | Temp 97.3°F | Ht 65.0 in | Wt 219.0 lb

## 2024-09-07 DIAGNOSIS — E1165 Type 2 diabetes mellitus with hyperglycemia: Secondary | ICD-10-CM

## 2024-09-07 DIAGNOSIS — L219 Seborrheic dermatitis, unspecified: Secondary | ICD-10-CM

## 2024-09-07 DIAGNOSIS — R062 Wheezing: Secondary | ICD-10-CM

## 2024-09-07 DIAGNOSIS — J4521 Mild intermittent asthma with (acute) exacerbation: Secondary | ICD-10-CM

## 2024-09-07 DIAGNOSIS — Z7985 Long-term (current) use of injectable non-insulin antidiabetic drugs: Secondary | ICD-10-CM

## 2024-09-07 DIAGNOSIS — J208 Acute bronchitis due to other specified organisms: Secondary | ICD-10-CM

## 2024-09-07 MED ORDER — BENZONATATE 100 MG PO CAPS: 100.0000 mg | ORAL_CAPSULE | Freq: Two times a day (BID) | ORAL | 0 refills | Status: AC | PRN

## 2024-09-07 MED ORDER — QVAR REDIHALER 40 MCG/ACT IN AERB: 1.0000 | INHALATION_SPRAY | Freq: Two times a day (BID) | RESPIRATORY_TRACT | 0 refills | Status: AC

## 2024-09-07 MED ORDER — KETOCONAZOLE 2 % EX SHAM: 1.0000 | MEDICATED_SHAMPOO | CUTANEOUS | 1 refills | Status: AC

## 2024-09-07 MED ORDER — AZITHROMYCIN 250 MG PO TABS: ORAL_TABLET | ORAL | 0 refills | Status: AC

## 2024-09-07 MED ORDER — ALBUTEROL SULFATE HFA 108 (90 BASE) MCG/ACT IN AERS: 2.0000 | INHALATION_SPRAY | Freq: Four times a day (QID) | RESPIRATORY_TRACT | 0 refills | Status: AC | PRN

## 2024-09-07 MED ORDER — PREDNISONE 20 MG PO TABS: 40.0000 mg | ORAL_TABLET | Freq: Every day | ORAL | 0 refills | Status: AC

## 2024-09-07 NOTE — Progress Notes (Signed)
 Subjective:     Patient ID: Debra Miles, female    DOB: 04-03-1996, 28 y.o.   MRN: [id number]  Chief Complaint  Patient presents with   Cough    Pt was sick last week and now can't get rid of cough and is SOB    Discussed the use of AI scribe software for clinical note transcription with the patient, who gave verbal consent to proceed.  History of Present Illness Debra Miles is a 28 year old female with asthma and diabetes who presents with worsening cough and congestion.  She has been experiencing a worsening cough and congestion for the past week. The cough is severe, causing shortness of breath and wheezing. She has been using her albuterol  inhaler frequently but feels like it is not working. There is no sore throat, but she brings up a little white mucus. No fever is reported, although her boyfriend suspected she might have had one recently.  Her asthma typically requires increased inhaler use during illness. She does not use her inhaler daily, only when sick. She previously used Qvar  intermitt but has run out. Steroids are usually needed during exacerbations. Her blood sugar levels have been under 200, and her A1c was 6.7 three months ago, an improvement from the previous year.  Regarding her diabetes, she is not currently using insulin  and uses it only when her blood sugar is high. She is on Ozempic , which has helped manage her diabetes, and she has not needed to use Novolog  70/30 or plain Novolog  unless her blood sugar exceeds 300.  She also has PCOS and notes that her periods have been regular until this month. She had unprotected intercourse a couple of months ago but had a period afterward, so she does not believe she is pregnant.  She reports having eczema that worsens in cold weather, particularly around her ears and scalp, causing dry and flaky skin. She has been using olive oil to moisturize the affected areas.    Health Maintenance Due  Topic Date Due    OPHTHALMOLOGY EXAM  Never done   Cervical Cancer Screening (Pap smear)  Never done   HEMOGLOBIN A1C  09/06/2024    Past Medical History:  Diagnosis Date   Anxiety    Diabetes (HCC)    HSV-1 infection    HSV-2 (herpes simplex virus 2) infection    Hypertension    PCOS (polycystic ovarian syndrome)     Past Surgical History:  Procedure Laterality Date   WISDOM TOOTH EXTRACTION       Current Outpatient Medications:    azithromycin  (ZITHROMAX ) 250 MG tablet, Take 2 tablets on day 1, then 1 tablet daily on days 2 through 5, Disp: 6 tablet, Rfl: 0   BD PEN NEEDLE NANO 2ND GEN 32G X 4 MM MISC, 1 EACH BY DOES NOT APPLY ROUTE 3 (THREE) TIMES DAILY, Disp: , Rfl:    benzonatate  (TESSALON ) 100 MG capsule, Take 1 capsule (100 mg total) by mouth 2 (two) times daily as needed for cough., Disp: 20 capsule, Rfl: 0   Continuous Glucose Sensor (FREESTYLE LIBRE 3 PLUS SENSOR) MISC, Change sensor every 15 days., Disp: 1 each, Rfl: 11   fluconazole  (DIFLUCAN ) 150 MG tablet, Take 1 tab po now. Then repeat dose at the end of antibiotic course., Disp: 2 tablet, Rfl: 1   fluticasone  (FLONASE ) 50 MCG/ACT nasal spray, Place 2 sprays into both nostrils daily., Disp: 16 g, Rfl: 3   insulin  aspart (NOVOLOG ) 100 UNIT/ML injection,  Inject 11 Units into the skin 3 (three) times daily before meals. Follow sliding scale. (Patient taking differently: Inject 11 Units into the skin 3 (three) times daily before meals. Follow sliding scale. Pt admits not had to take), Disp: , Rfl:    Insulin  Pen Needle (PEN NEEDLES) 31G X 5 MM MISC, 1 each by Does not apply route 3 (three) times daily. May dispense any manufacturer covered by patient's insurance., Disp: 100 each, Rfl: 11   [START ON 09/09/2024] ketoconazole  (NIZORAL ) 2 % shampoo, Apply 1 Application topically 2 (two) times a week., Disp: 120 mL, Rfl: 1   olmesartan -hydrochlorothiazide  (BENICAR  HCT) 20-12.5 MG tablet, TAKE 1 TABLET BY MOUTH EVERY DAY, Disp: 90 tablet, Rfl: 1    ondansetron  (ZOFRAN ) 4 MG tablet, Take 1 tablet (4 mg total) by mouth every 8 (eight) hours as needed for nausea or vomiting., Disp: 20 tablet, Rfl: 0   predniSONE  (DELTASONE ) 20 MG tablet, Take 2 tablets (40 mg total) by mouth daily with breakfast for 5 days., Disp: 10 tablet, Rfl: 0   Semaglutide , 1 MG/DOSE, 4 MG/3ML SOPN, Inject 1 mg as directed once a week., Disp: 3 mL, Rfl: 2   valACYclovir  (VALTREX ) 1000 MG tablet, TAKE 2 TABLETS BY MOUTH TWICE A DAY FOR 1 DAY AT FIRST SIGN OF COLD SORE, Disp: 20 tablet, Rfl: 1   albuterol  (VENTOLIN  HFA) 108 (90 Base) MCG/ACT inhaler, Inhale 2 puffs into the lungs every 6 (six) hours as needed for wheezing or shortness of breath (Cough)., Disp: 18 g, Rfl: 0   beclomethasone (QVAR  REDIHALER) 40 MCG/ACT inhaler, Inhale 1 puff into the lungs 2 (two) times daily., Disp: 1 each, Rfl: 0  Allergies  Allergen Reactions   Metformin  And Related Diarrhea and Nausea And Vomiting   ROS neg/noncontributory except as noted HPI/below      Objective:     BP 138/84 (BP Location: Left Arm, Patient Position: Sitting, Cuff Size: Normal)   Pulse 76   Temp (!) 97.3 F (36.3 C) (Temporal)   Ht 5' 5 (1.651 m)   Wt 219 lb (99.3 kg)   LMP 08/02/2024 (Approximate)   BMI 36.44 kg/m  Wt Readings from Last 3 Encounters:  09/07/24 219 lb (99.3 kg)  07/06/24 219 lb (99.3 kg)  06/07/24 217 lb 9.6 oz (98.7 kg)    Physical Exam GENERAL: Well developed, well nourished, no acute distress. HEAD EYES EARS NOSE THROAT: Normocephalic, atraumatic, conjunctiva not injected, sclera nonicteric, oral cavity normal. +congestion CARDIAC: Regular rate and rhythm, S1 S2 present NECK: Supple, no thyromegaly, no nodes, LUNGS: Clear to auscultation bilaterally, no wheezes. EXTREMITIES: No edema. MUSCULOSKELETAL: No gross abnormalities. NEUROLOGICAL: Alert and oriented x3, cranial nerves II through XII intact. PSYCHIATRIC: Normal mood, good eye contact. SKIN: Eczema in ears and behind  ears, flaky skin on scalp and eyebrows, rash on scalp.  Reviewed labs     Assessment & Plan:  Mild intermittent asthmatic bronchitis with acute exacerbation  Viral bronchitis -     Qvar  RediHaler; Inhale 1 puff into the lungs 2 (two) times daily.  Dispense: 1 each; Refill: 0  Wheezing -     Qvar  RediHaler; Inhale 1 puff into the lungs 2 (two) times daily.  Dispense: 1 each; Refill: 0  Seborrheic dermatitis  Type 2 diabetes mellitus with hyperglycemia, without long-term current use of insulin  (HCC)  Long-term current use of injectable noninsulin antidiabetic medication  Other orders -     Albuterol  Sulfate HFA; Inhale 2 puffs into the lungs every  6 (six) hours as needed for wheezing or shortness of breath (Cough).  Dispense: 18 g; Refill: 0 -     Azithromycin ; Take 2 tablets on day 1, then 1 tablet daily on days 2 through 5  Dispense: 6 tablet; Refill: 0 -     predniSONE ; Take 2 tablets (40 mg total) by mouth daily with breakfast for 5 days.  Dispense: 10 tablet; Refill: 0 -     Benzonatate ; Take 1 capsule (100 mg total) by mouth 2 (two) times daily as needed for cough.  Dispense: 20 capsule; Refill: 0 -     Ketoconazole ; Apply 1 Application topically 2 (two) times a week.  Dispense: 120 mL; Refill: 1    Assessment and Plan Assessment & Plan Mild intermittent asthma with acute exacerbation   She experiences increased coughing, congestion, and shortness of breath, with increased albuterol  inhaler use without relief. There is no wheezing or fever, and her cough produces white mucus without a sore throat. Typically, her asthma is managed with an inhaler during illness, but the current exacerbation requires additional intervention. Prescribe prednisone  and zpack for the acute exacerbation and renew the albuterol  inhaler prescription. Restart Qvar  until symptoms improve, then return to as-needed use. Prescribe Tessalon  Perles for cough management.  Type 2 diabetes mellitus   Her diabetes  is well-controlled with a recent A1c of 6.7 and blood sugars generally under 200 mg/dL. She does not currently use insulin , only as-needed for hyperglycemia. Ozempic  effectively manages her diabetes. Steroid use may elevate blood sugars, requiring monitoring and potential insulin  use. Monitor blood sugars closely during steroid treatment and use short-acting insulin  as needed if blood sugars rise to 200-300 mg/dL.  Polycystic ovary syndrome   She has PCOS with regular menstrual cycles but recently missed a period, likely due to PCOS. Ozempic  may aid in managing PCOS symptoms.  Seborrheic dermatitis   She has flaky skin on her scalp, eyebrows, and behind her ears, exacerbated by cold weather. There has been no prior treatment with prescription shampoo. Prescribe Nizoral  shampoo for seborrheic dermatitis, to be used twice a week.     Return if symptoms worsen or fail to improve.  Jenkins CHRISTELLA Carrel, MD

## 2024-09-07 NOTE — Patient Instructions (Signed)
 Happy Holidays!!!!

## 2024-10-12 ENCOUNTER — Ambulatory Visit: Admitting: Physician Assistant

## 2024-10-29 ENCOUNTER — Encounter: Payer: Self-pay | Admitting: Emergency Medicine

## 2024-10-29 ENCOUNTER — Ambulatory Visit
Admission: EM | Admit: 2024-10-29 | Discharge: 2024-10-29 | Disposition: A | Discharge status: Home / Self Care | Attending: Emergency Medicine | Admitting: Emergency Medicine

## 2024-10-29 ENCOUNTER — Ambulatory Visit (INDEPENDENT_AMBULATORY_CARE_PROVIDER_SITE_OTHER)

## 2024-10-29 ENCOUNTER — Ambulatory Visit (HOSPITAL_COMMUNITY)

## 2024-10-29 DIAGNOSIS — M546 Pain in thoracic spine: Secondary | ICD-10-CM | POA: Diagnosis not present

## 2024-10-29 DIAGNOSIS — M25511 Pain in right shoulder: Secondary | ICD-10-CM

## 2024-10-29 MED ORDER — CYCLOBENZAPRINE HCL 10 MG PO TABS: 10.0000 mg | ORAL_TABLET | Freq: Two times a day (BID) | ORAL | 0 refills | Status: AC | PRN

## 2024-10-29 MED ORDER — PREDNISONE 10 MG (21) PO TBPK: ORAL_TABLET | Freq: Every day | ORAL | 0 refills | Status: AC

## 2024-10-29 MED ORDER — KETOROLAC TROMETHAMINE 30 MG/ML IJ SOLN
30.0000 mg | Freq: Once | INTRAMUSCULAR | Status: AC
  Administered 2024-10-29: 30 mg via INTRAMUSCULAR

## 2024-10-29 NOTE — ED Provider Notes (Signed)
 " [facility]    CSN: [id number] Arrival date & time: 10/29/24  1714      History   Chief Complaint Chief Complaint  Patient presents with   Shoulder Injury    HPI TAMAIYA BUMP is a 29 y.o. female.   Patient presents for evaluation of right sided shoulder pain beginning 4 days ago after fall.  Endorses she was walking dog she tripped, cannot remember landing and does not recall falling of the shoulders but symptoms started the morning after.  Has been constant radiating from the mid back to the top of the shoulder described as sharp shooting pain.  Only able to lift arm approximately halfway before pain is elicited.  Denies numbness or tingling.  Has been using ibuprofen  600 mg consistently without improvement.    Past Medical History:  Diagnosis Date   Anxiety    Diabetes (HCC)    HSV-1 infection    HSV-2 (herpes simplex virus 2) infection    Hypertension    PCOS (polycystic ovarian syndrome)     Patient Active Problem List   Diagnosis Date Noted   Abnormal uterine bleeding 01/01/2024   Irregular periods 01/01/2024   Vaginal irritation 01/01/2024   Pyelonephritis 12/27/2023   Fatty liver 07/03/2023   Gallstones 07/03/2023   Hypertensive disorder 10/08/2022   Polycystic ovaries 10/08/2022   Family history of breast cancer in female 09/19/2022   Bacterial vaginosis 09/19/2022   HSV (herpes simplex virus) infection 09/03/2022   Type 2 diabetes mellitus with hyperglycemia, without long-term current use of insulin  (HCC) 09/03/2022   PCOS (polycystic ovarian syndrome) 09/03/2022   Essential hypertension 09/03/2022   Type 2 diabetes mellitus (HCC) 04/22/2012   Polycystic ovarian syndrome 04/20/2012    Past Surgical History:  Procedure Laterality Date   WISDOM TOOTH EXTRACTION      OB History     Gravida  0   Para      Term      Preterm      AB      Living         SAB      IAB      Ectopic      Multiple      Live Births                Home Medications    Prior to Admission medications  Medication Sig Start Date End Date Taking? Authorizing Provider  albuterol  (VENTOLIN  HFA) 108 (90 Base) MCG/ACT inhaler Inhale 2 puffs into the lungs every 6 (six) hours as needed for wheezing or shortness of breath (Cough). 09/07/24   Wendolyn Jenkins Jansky, MD  BD PEN NEEDLE NANO 2ND GEN 32G X 4 MM MISC 1 EACH BY DOES NOT APPLY ROUTE 3 (THREE) TIMES DAILY 01/01/24   [provider]  beclomethasone (QVAR  REDIHALER) 40 MCG/ACT inhaler Inhale 1 puff into the lungs 2 (two) times daily. 09/07/24   Wendolyn Jenkins Jansky, MD  benzonatate  (TESSALON ) 100 MG capsule Take 1 capsule (100 mg total) by mouth 2 (two) times daily as needed for cough. 09/07/24   Wendolyn Jenkins Jansky, MD  Continuous Glucose Sensor (FREESTYLE LIBRE 3 PLUS SENSOR) MISC Change sensor every 15 days. 01/01/24   Allwardt, Alyssa M, PA-C  fluconazole  (DIFLUCAN ) 150 MG tablet Take 1 tab po now. Then repeat dose at the end of antibiotic course. 06/10/24   Allwardt, Alyssa M, PA-C  fluticasone  (FLONASE ) 50 MCG/ACT nasal spray Place 2 sprays into both nostrils daily.  10/08/22   Allwardt, Alyssa M, PA-C  insulin  aspart (NOVOLOG ) 100 UNIT/ML injection Inject 11 Units into the skin 3 (three) times daily before meals. Follow sliding scale. Patient taking differently: Inject 11 Units into the skin 3 (three) times daily before meals. Follow sliding scale. Pt admits not had to take    [provider]  Insulin  Pen Needle (PEN NEEDLES) 31G X 5 MM MISC 1 each by Does not apply route 3 (three) times daily. May dispense any manufacturer covered by patient's insurance. 01/01/24   Allwardt, Alyssa M, PA-C  ketoconazole  (NIZORAL ) 2 % shampoo Apply 1 Application topically 2 (two) times a week. 09/09/24   Wendolyn Jenkins Jansky, MD  olmesartan -hydrochlorothiazide  (BENICAR  HCT) 20-12.5 MG tablet TAKE 1 TABLET BY MOUTH EVERY DAY 07/26/24   Allwardt, Alyssa M, PA-C  ondansetron  (ZOFRAN ) 4 MG tablet Take 1 tablet (4  mg total) by mouth every 8 (eight) hours as needed for nausea or vomiting. 06/10/24   Allwardt, Alyssa M, PA-C  valACYclovir  (VALTREX ) 1000 MG tablet TAKE 2 TABLETS BY MOUTH TWICE A DAY FOR 1 DAY AT FIRST SIGN OF COLD SORE 09/02/24   Allwardt, Mardy HERO, PA-C    Family History Family History  Problem Relation Age of Onset   Hyperthyroidism Mother    Hypertension Father    Hyperthyroidism Brother    Cancer Maternal Grandmother    Diabetes Paternal Grandmother    Diabetes Paternal Grandfather    Heart disease Paternal Grandfather    Kidney disease Paternal Grandfather    Hypertension Paternal Grandfather    Diabetes Paternal Aunt    Breast cancer Paternal Aunt    Breast cancer Paternal Aunt        mom to her - passed in April 2024 after pneumonia    Social History Social History[1]   Allergies   Metformin  and related   Review of Systems Review of Systems   Physical Exam Triage Vital Signs ED Triage Vitals  Encounter Vitals Group     BP 10/29/24 1815 129/75     Girls Systolic BP Percentile --      Girls Diastolic BP Percentile --      Boys Systolic BP Percentile --      Boys Diastolic BP Percentile --      Pulse Rate 10/29/24 1815 83     Resp 10/29/24 1815 20     Temp 10/29/24 1815 98.6 F (37 C)     Temp Source 10/29/24 1815 Oral     SpO2 10/29/24 1815 97 %     Weight --      Height --      Head Circumference --      Peak Flow --      Pain Score 10/29/24 1812 8     Pain Loc --      Pain Education --      Exclude from Growth Chart --    No data found.  Updated Vital Signs BP 129/75 (BP Location: Left Arm)   Pulse 83   Temp 98.6 F (37 C) (Oral)   Resp 20   LMP 10/22/2024 (Exact Date)   SpO2 97%   Visual Acuity Right Eye Distance:   Left Eye Distance:   Bilateral Distance:    Right Eye Near:   Left Eye Near:    Bilateral Near:     Physical Exam Constitutional:      Appearance: Normal appearance.  Eyes:     Extraocular Movements: Extraocular  movements intact.  Pulmonary:     Effort: Pulmonary effort is normal.  Musculoskeletal:     Comments: Tenderness present from the superior of the shoulder extending into the trapezius until the thoracic region of the back, no spinal tenderness noted, no ecchymosis swelling or deformity, no AC joint tenderness, negative Hawking sign, able to complete full range of motion but pain is elicited with abduction at about 45 degrees  Neurological:     Mental Status: She is alert and oriented to person, place, and time.      UC Treatments / Results  Labs (all labs ordered are listed, but only abnormal results are displayed) Labs Reviewed - No data to display  EKG   Radiology No results found.  Procedures Procedures (including critical care time)  Medications Ordered in UC Medications - No data to display  Initial Impression / Assessment and Plan / UC Course  I have reviewed the triage vital signs and the nursing notes.  Pertinent labs & imaging results that were available during my care of the patient were reviewed by me and considered in my medical decision making (see chart for details).  Acute pain in the right shoulder, acute right-sided thoracic back pain  Etiology appears to be muscular but due to fall x-rays pending, notifying patient of results by telephone, Toradol  IM given and prescribed prednisone  and Flexeril  for home use recommended supportive care through [facility], heat massage stretching with activity as tolerated, given written handout for stretches, fracture noted patient to follow-up with orthopedics, walking referral given Final Clinical Impressions(s) / UC Diagnoses   Final diagnoses:  Acute pain of right shoulder   Discharge Instructions   None    ED Prescriptions   None    PDMP not reviewed this encounter.     [1]  Social History Tobacco Use   Smoking status: Never   Smokeless tobacco: Never  Vaping Use   Vaping status: Some Days   Substances:  Nicotine, Flavoring  Substance Use Topics   Alcohol use: Yes    Comment: occasionally   Drug use: Yes    Types: Marijuana     Teresa Shelba SAUNDERS, NP 10/29/24 1904  "

## 2024-10-29 NOTE — ED Triage Notes (Signed)
 Patient reports that she fell on 10-25-24. Patient now complains of pain to right shoulder area. Rates 8/10. Patient has taken Ibuprofen  and Tylenol  with no relief.

## 2024-10-29 NOTE — Discharge Instructions (Addendum)
 X-ray is pending and you will be notified of results via telephone  On exam pain is localized over the big back muscles of the trapezius and the latissimus dorsi  You been given an injection of Toradol  to help reduce inflammation and pain and will see improvement within about 30 minutes  *Tomorrow take prednisone  every morning with food to continue the above process May use Tylenol  or any topical medicines additionally, stop use of ibuprofen  during treatment  You may use muscle relaxer twice daily as needed for additional comfort be mindful this can make you feel drowsy  You may use heating pad in 15 minute intervals as needed for additional comfort  Begin stretching affected area daily for 10 minutes as tolerated to further loosen muscles, inside of packet  When lying down place pillow underneath underneath the arm and behind the back  Can try sleeping without pillow on firm mattress as this keeps the spine in alignment  Practice good posture: head back, shoulders back, chest forward, pelvis back and weight distributed evenly on both legs  If pain persist after recommended treatment or reoccurs if may be beneficial to follow up with orthopedic specialist for evaluation, this doctor specializes in the bones and can manage your symptoms long-term with options such as but not limited to imaging, medications or physical therapy

## 2024-11-09 ENCOUNTER — Ambulatory Visit: Admitting: Physician Assistant

## 2024-11-24 ENCOUNTER — Ambulatory Visit: Admitting: Physician Assistant
